# Patient Record
Sex: Female | Born: 1955
Health system: Southern US, Community
[De-identification: ages and names within clinical notes are randomized; demographics above are authoritative.]

## PROBLEM LIST (undated history)

## (undated) DIAGNOSIS — U071 COVID-19: Secondary | ICD-10-CM

## (undated) DIAGNOSIS — Z8669 Personal history of other diseases of the nervous system and sense organs: Secondary | ICD-10-CM

## (undated) DIAGNOSIS — N6019 Diffuse cystic mastopathy of unspecified breast: Secondary | ICD-10-CM

## (undated) DIAGNOSIS — M3500 Sicca syndrome, unspecified: Secondary | ICD-10-CM

## (undated) DIAGNOSIS — E785 Hyperlipidemia, unspecified: Secondary | ICD-10-CM

## (undated) DIAGNOSIS — M8580 Other specified disorders of bone density and structure, unspecified site: Secondary | ICD-10-CM

## (undated) DIAGNOSIS — E213 Hyperparathyroidism, unspecified: Secondary | ICD-10-CM

## (undated) DIAGNOSIS — R944 Abnormal results of kidney function studies: Secondary | ICD-10-CM

## (undated) HISTORY — PX: APPENDECTOMY: SHX54

## (undated) HISTORY — DX: Diffuse cystic mastopathy of unspecified breast: N60.19

## (undated) HISTORY — PX: OTHER SURGICAL HISTORY: SHX169

## (undated) HISTORY — DX: Hyperlipidemia, unspecified: E78.5

## (undated) HISTORY — DX: Sjogren syndrome, unspecified: M35.00

## (undated) HISTORY — DX: COVID-19: U07.1

## (undated) HISTORY — PX: TONSILLECTOMY AND ADENOIDECTOMY: SUR1326

## (undated) HISTORY — PX: WISDOM TOOTH EXTRACTION: SHX21

## (undated) HISTORY — DX: Hyperparathyroidism, unspecified: E21.3

## (undated) HISTORY — PX: PARATHYROIDECTOMY: SHX19

## (undated) HISTORY — DX: Other specified disorders of bone density and structure, unspecified site: M85.80

## (undated) HISTORY — PX: BREAST EXCISIONAL BIOPSY: SUR124

## (undated) HISTORY — PX: BREAST BIOPSY: SHX20

## (undated) HISTORY — DX: Personal history of other diseases of the nervous system and sense organs: Z86.69

## (undated) HISTORY — DX: Abnormal results of kidney function studies: R94.4

---

## 1997-09-16 ENCOUNTER — Ambulatory Visit (HOSPITAL_COMMUNITY): Admission: RE | Admit: 1997-09-16 | Discharge: 1997-09-16 | Payer: Self-pay | Admitting: Family Medicine

## 1998-10-02 ENCOUNTER — Ambulatory Visit (HOSPITAL_COMMUNITY): Admission: RE | Admit: 1998-10-02 | Discharge: 1998-10-02 | Payer: Self-pay | Admitting: Family Medicine

## 1999-12-04 ENCOUNTER — Ambulatory Visit (HOSPITAL_COMMUNITY): Admission: RE | Admit: 1999-12-04 | Discharge: 1999-12-04 | Payer: Self-pay | Admitting: Family Medicine

## 1999-12-04 ENCOUNTER — Encounter: Payer: Self-pay | Admitting: Family Medicine

## 2000-02-18 ENCOUNTER — Other Ambulatory Visit: Admission: RE | Admit: 2000-02-18 | Discharge: 2000-02-18 | Payer: Self-pay | Admitting: Family Medicine

## 2001-01-27 ENCOUNTER — Encounter: Payer: Self-pay | Admitting: Family Medicine

## 2001-01-27 ENCOUNTER — Ambulatory Visit (HOSPITAL_COMMUNITY): Admission: RE | Admit: 2001-01-27 | Discharge: 2001-01-27 | Payer: Self-pay | Admitting: Family Medicine

## 2001-02-26 ENCOUNTER — Other Ambulatory Visit: Admission: RE | Admit: 2001-02-26 | Discharge: 2001-02-26 | Payer: Self-pay | Admitting: Family Medicine

## 2001-07-08 HISTORY — PX: OTHER SURGICAL HISTORY: SHX169

## 2002-02-16 ENCOUNTER — Encounter: Payer: Self-pay | Admitting: Family Medicine

## 2002-02-16 ENCOUNTER — Ambulatory Visit (HOSPITAL_COMMUNITY): Admission: RE | Admit: 2002-02-16 | Discharge: 2002-02-16 | Payer: Self-pay | Admitting: Family Medicine

## 2002-03-15 ENCOUNTER — Other Ambulatory Visit: Admission: RE | Admit: 2002-03-15 | Discharge: 2002-03-15 | Payer: Self-pay | Admitting: Family Medicine

## 2002-04-19 ENCOUNTER — Ambulatory Visit (HOSPITAL_COMMUNITY): Admission: RE | Admit: 2002-04-19 | Discharge: 2002-04-19 | Payer: Self-pay | Admitting: Obstetrics and Gynecology

## 2003-03-01 ENCOUNTER — Ambulatory Visit (HOSPITAL_COMMUNITY): Admission: RE | Admit: 2003-03-01 | Discharge: 2003-03-01 | Payer: Self-pay | Admitting: Family Medicine

## 2003-03-01 ENCOUNTER — Encounter: Payer: Self-pay | Admitting: Family Medicine

## 2003-04-12 ENCOUNTER — Other Ambulatory Visit: Admission: RE | Admit: 2003-04-12 | Discharge: 2003-04-12 | Payer: Self-pay | Admitting: Obstetrics and Gynecology

## 2004-03-01 ENCOUNTER — Ambulatory Visit (HOSPITAL_COMMUNITY): Admission: RE | Admit: 2004-03-01 | Discharge: 2004-03-01 | Payer: Self-pay | Admitting: Family Medicine

## 2004-04-17 ENCOUNTER — Other Ambulatory Visit: Admission: RE | Admit: 2004-04-17 | Discharge: 2004-04-17 | Payer: Self-pay | Admitting: Family Medicine

## 2004-04-27 ENCOUNTER — Ambulatory Visit (HOSPITAL_COMMUNITY): Admission: RE | Admit: 2004-04-27 | Discharge: 2004-04-27 | Payer: Self-pay | Admitting: Family Medicine

## 2005-03-04 ENCOUNTER — Ambulatory Visit (HOSPITAL_COMMUNITY): Admission: RE | Admit: 2005-03-04 | Discharge: 2005-03-04 | Payer: Self-pay | Admitting: Family Medicine

## 2005-04-18 ENCOUNTER — Other Ambulatory Visit: Admission: RE | Admit: 2005-04-18 | Discharge: 2005-04-18 | Payer: Self-pay | Admitting: Family Medicine

## 2006-03-06 ENCOUNTER — Ambulatory Visit (HOSPITAL_COMMUNITY): Admission: RE | Admit: 2006-03-06 | Discharge: 2006-03-06 | Payer: Self-pay | Admitting: Family Medicine

## 2006-04-28 ENCOUNTER — Other Ambulatory Visit: Admission: RE | Admit: 2006-04-28 | Discharge: 2006-04-28 | Payer: Self-pay | Admitting: Family Medicine

## 2006-04-30 ENCOUNTER — Ambulatory Visit (HOSPITAL_COMMUNITY): Admission: RE | Admit: 2006-04-30 | Discharge: 2006-04-30 | Payer: Self-pay | Admitting: Family Medicine

## 2007-03-31 ENCOUNTER — Ambulatory Visit (HOSPITAL_COMMUNITY): Admission: RE | Admit: 2007-03-31 | Discharge: 2007-03-31 | Payer: Self-pay | Admitting: Family Medicine

## 2007-05-12 ENCOUNTER — Other Ambulatory Visit: Admission: RE | Admit: 2007-05-12 | Discharge: 2007-05-12 | Payer: Self-pay | Admitting: Family Medicine

## 2007-06-09 IMAGING — US US CAROTID DUPLEX BILAT
1 series · 14 of 24 positions shown · non-contrast
Comparison: NONE

CLINICAL DATA: cc. MMAMOSA MOSA PATEL-WAYNE  Abnormal [facility] screening. 

BILATERAL DUPLEX CAROTID ULTRASOUND

[Series 1: us carotid · 0.06mm/px · 14 of 56 slices shown]
[im 1/56]
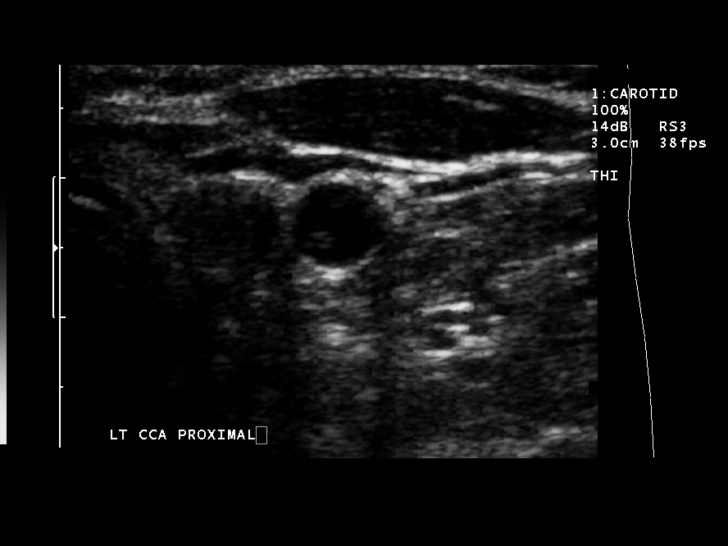
[im 5/56]
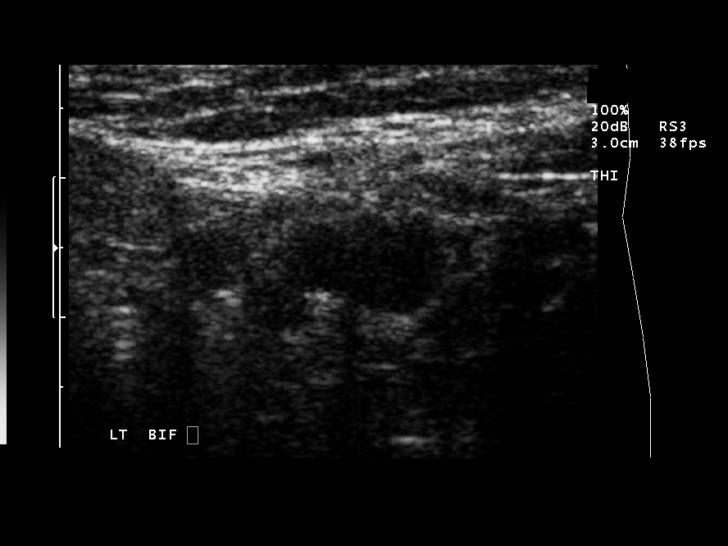
[im 10/56]
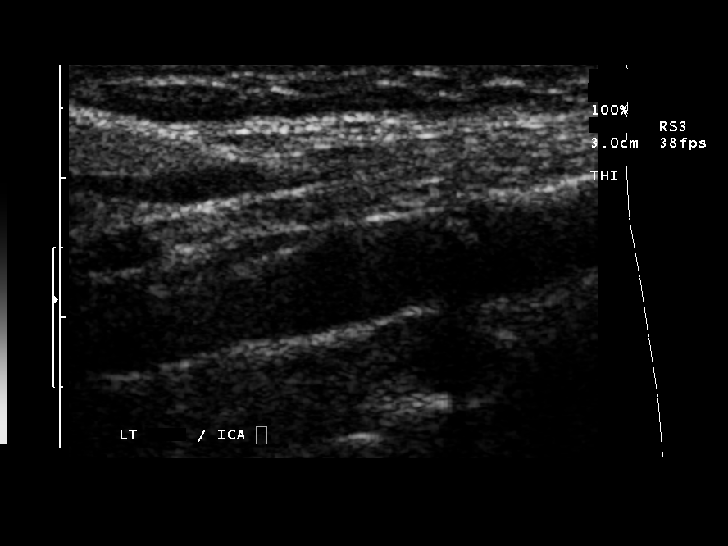
[im 15/56]
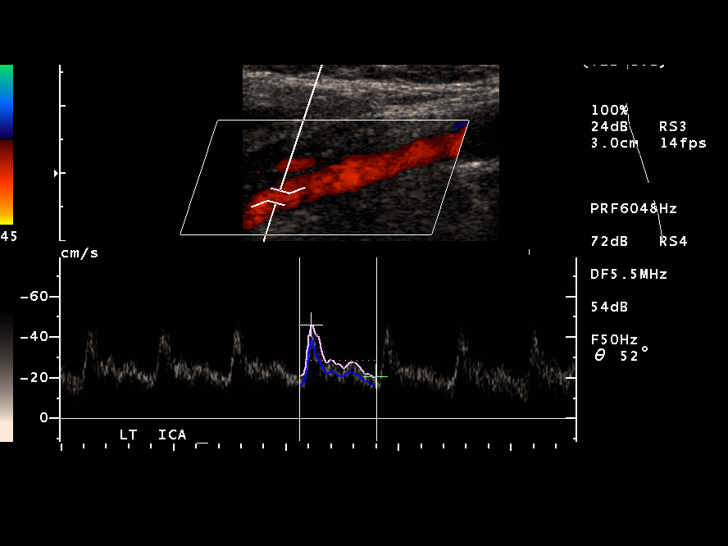
[im 17/56]
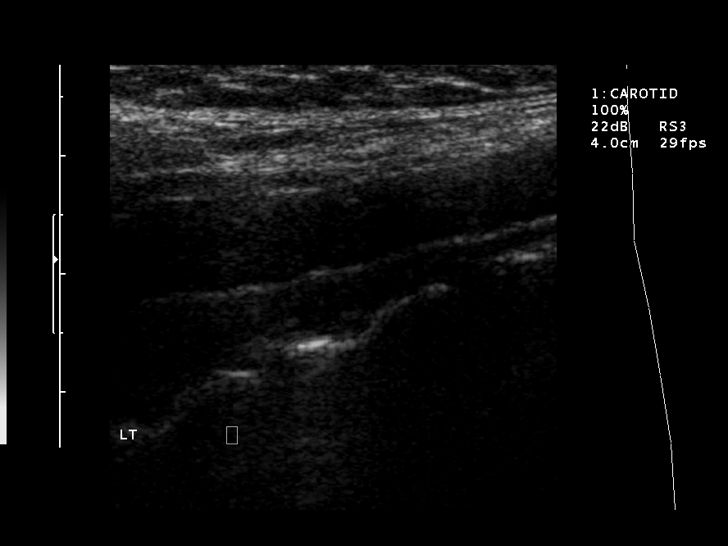
[im 22/56]
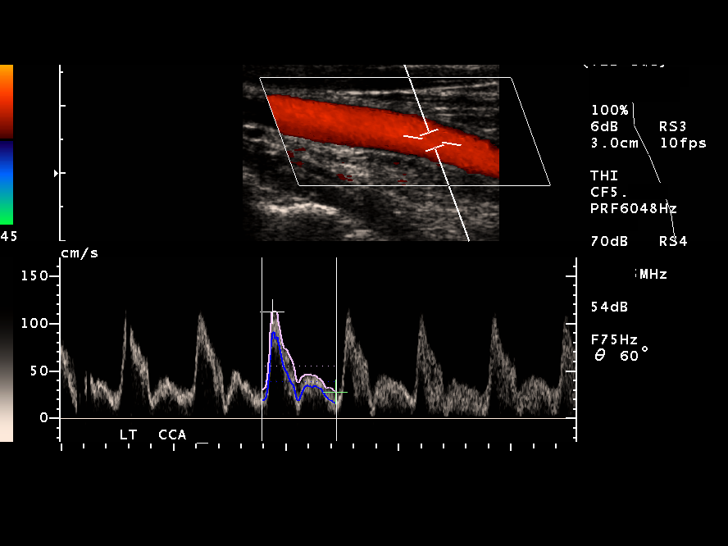
[im 27/56]
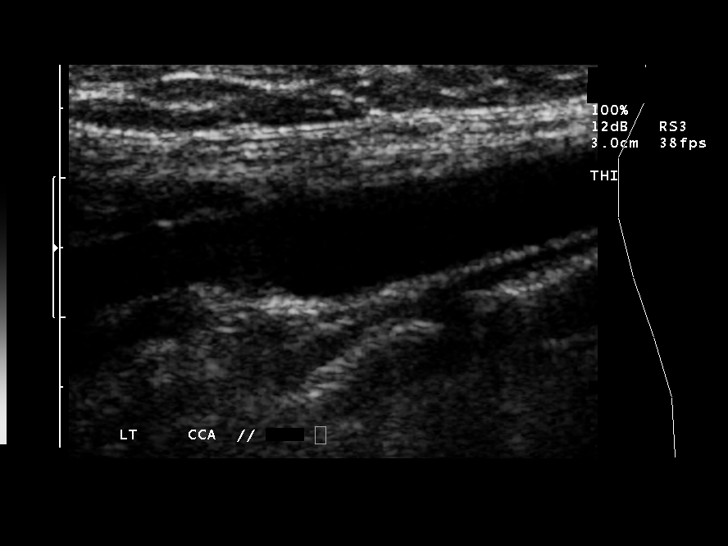
[im 29/56]
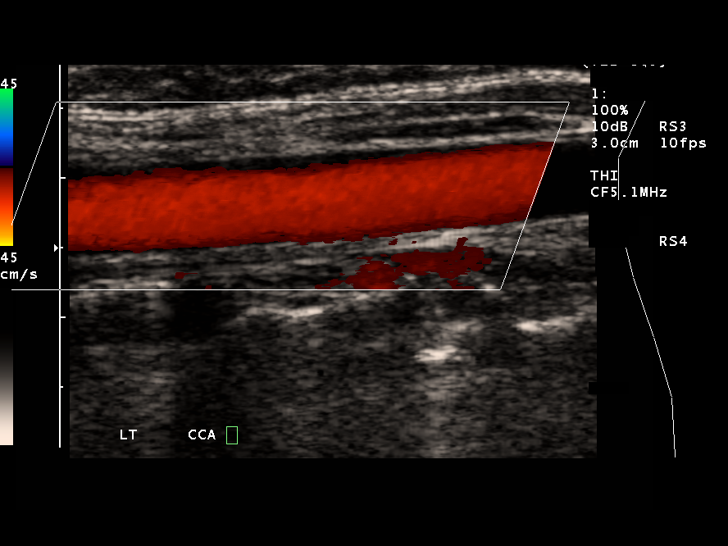
[im 34/56]
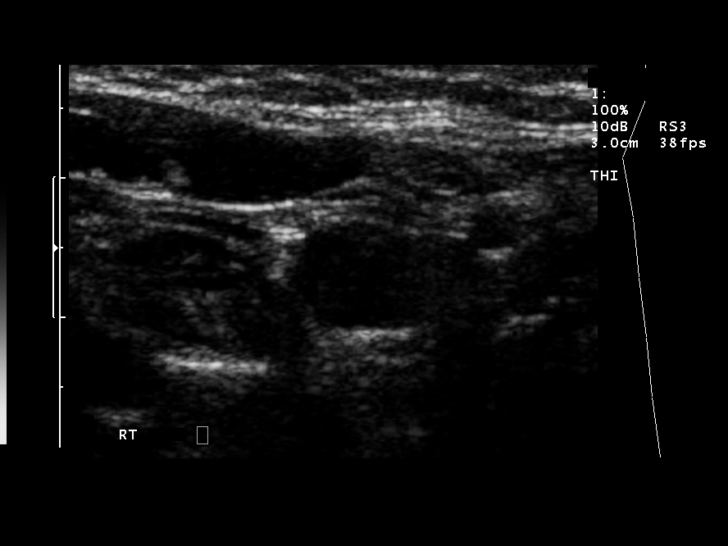
[im 39/56]
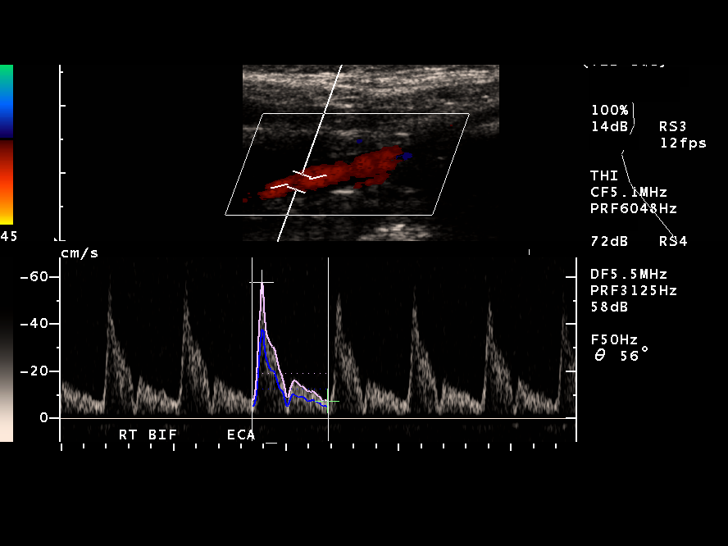
[im 44/56]
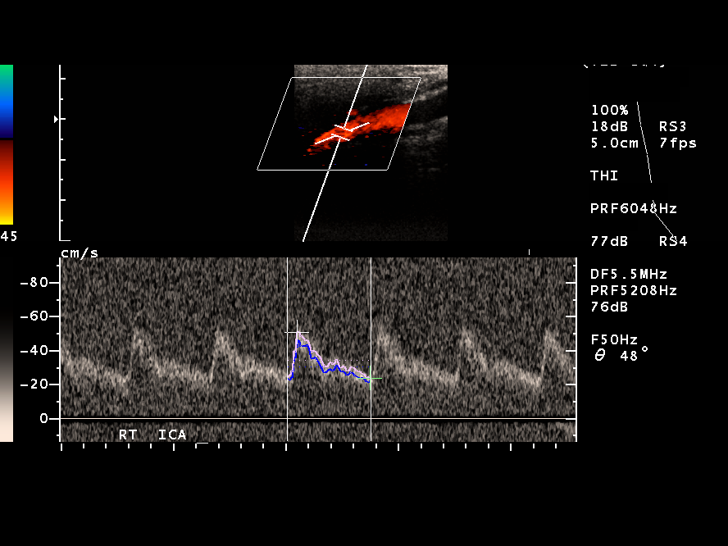
[im 46/56]
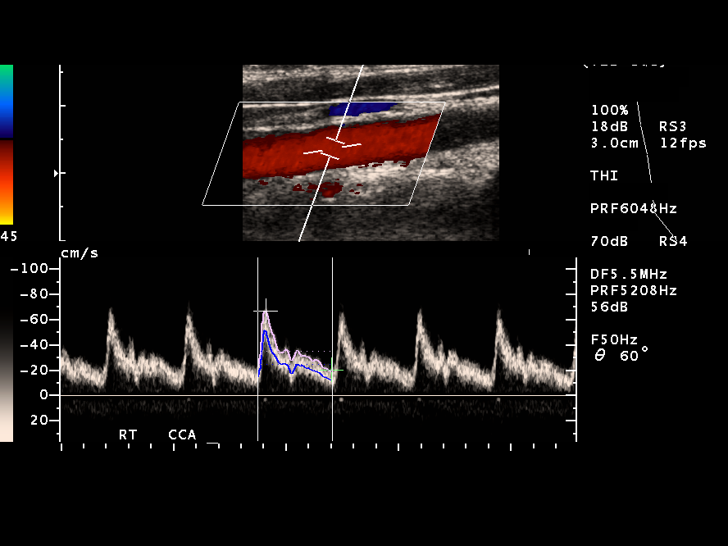
[im 51/56]
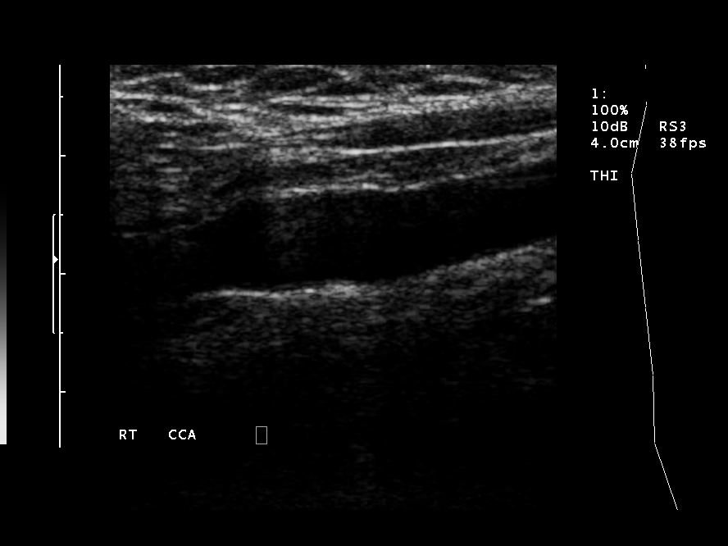
[im 56/56]
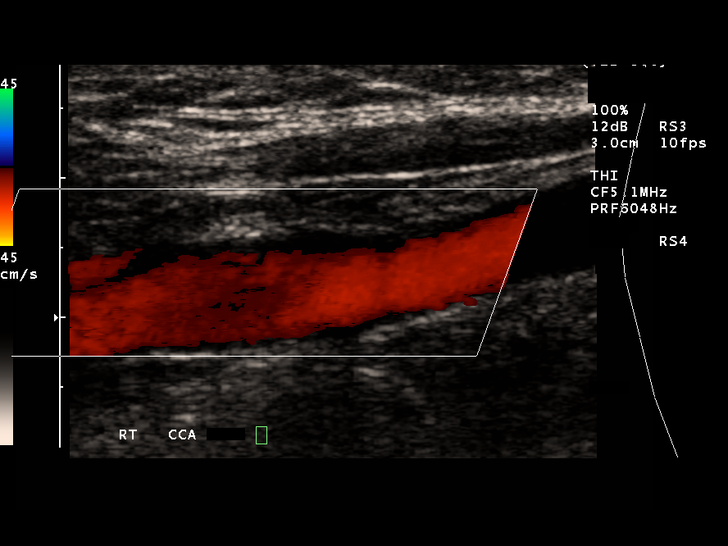

[14 of 24 positions shown; findings below may reference images not displayed]

FINDINGS RIGHT CAROTID: CCA 93, ICA 51, diastolic 24, ratio
Vertebral flow: antegrade LEFT CAROTID: CCA 112, ICA 66, diastolic 
26, ratio 0.59 Vertebral flow: antegrade Examination of the right 
carotid system demonstrates normal appearances without plaque 
present. Velocity measurements are within normal range. 
Examination of the left carotid system demonstrates minimal 
heterogeneous nodular plaque in the carotid bulb.
IMPRESSION: Normal appearance of the right carotid system. 
Minimal plaque in the left carotid bulb. Degree of stenosis of the 
left internal carotid artery is near 0%. Blayne Gonzalezz, M.D. 
electronically reviewed on 06/10/2007 Dict Date: 06/10/2007  Tran 
Date: 06/10/2007 CAV  [REDACTED]

## 2008-04-06 ENCOUNTER — Ambulatory Visit (HOSPITAL_COMMUNITY): Admission: RE | Admit: 2008-04-06 | Discharge: 2008-04-06 | Payer: Self-pay | Admitting: Family Medicine

## 2008-04-14 ENCOUNTER — Encounter: Admission: RE | Admit: 2008-04-14 | Discharge: 2008-04-14 | Payer: Self-pay | Admitting: Family Medicine

## 2008-10-11 ENCOUNTER — Encounter: Admission: RE | Admit: 2008-10-11 | Discharge: 2008-10-11 | Payer: Self-pay | Admitting: Family Medicine

## 2009-04-05 ENCOUNTER — Encounter: Admission: RE | Admit: 2009-04-05 | Discharge: 2009-04-05 | Payer: Self-pay | Admitting: Family Medicine

## 2009-05-25 ENCOUNTER — Other Ambulatory Visit: Admission: RE | Admit: 2009-05-25 | Discharge: 2009-05-25 | Payer: Self-pay | Admitting: Family Medicine

## 2010-04-10 ENCOUNTER — Ambulatory Visit (HOSPITAL_COMMUNITY): Admission: RE | Admit: 2010-04-10 | Discharge: 2010-04-10 | Payer: Self-pay | Admitting: Family Medicine

## 2010-07-18 ENCOUNTER — Other Ambulatory Visit
Admission: RE | Admit: 2010-07-18 | Discharge: 2010-07-18 | Payer: Self-pay | Source: Home / Self Care | Admitting: Family Medicine

## 2010-11-23 NOTE — Op Note (Signed)
NAME:  Karen Bass, Karen Bass                        ACCOUNT NO.:  [id number]   MEDICAL RECORD NO.:  [id number]                   PATIENT TYPE:  AMB   LOCATION:  SDC                                  FACILITY:  [facility]   PHYSICIAN:  Cynthia P. Romine, M.D.             DATE OF BIRTH:  1956-03-09   DATE OF PROCEDURE:  04/19/2002  DATE OF DISCHARGE:                                 OPERATIVE REPORT   PREOPERATIVE DIAGNOSIS:  Desires attempt at permanent surgical  sterilization.   POSTOPERATIVE DIAGNOSIS:  Desires attempt at permanent surgical  sterilization.   PROCEDURE:  Right-sided tubal sterilization procedure, left side was status  post left salpingo-oophorectomy.   SURGEON:  Cynthia P. Romine, M.D.   ANESTHESIA:  General endotracheal.   ESTIMATED BLOOD LOSS:  Minimal.   COMPLICATIONS:  None.   DESCRIPTION OF PROCEDURE:  The patient was taken to the operating room and  after the induction of adequate general anesthesia was prepped and draped in  the usual fashion.  A Hulka uterine manipulator was placed.  The bladder was  drained with a red rubber catheter.  A small infraumbilical incision was  made and the Veress needle was inserted into the peritoneal space.  Proper  placement was tested by noting free flow of saline through the Veress needle  with a negative aspirate and then by negative response of saline placed at  the hub of the Veress needle to negative pressure as the abdominal wall was  elevated.  Pneumoperitoneum was created with 2.5 L of CO2 using the  automatic insufflator.  A disposable 10/11 mm trocar was then inserted into  the peritoneal space and its proper placement noted with the laparoscope.  Next a small suprapubic incision was made in the midline and the lower  trocar, 8 mm, was inserted under direct visualization.  The pelvis was  inspected.  The anterior and posterior cul-de-sac were normal.  On the  patient's left side the tube and ovary were absent, and  there were adhesions  of fat to the left side of the uterus and the pelvic sidewall.  On the right  the tube was somewhat thickened but was mobile.  The ovary appeared normal.  The tube was traced to its fimbriated end, the midisthmic portion was  elevated, and the Falope ring was placed.  A good knuckle of tube was noted  to be contained within the ring and good blanching was encountered.  Photographic documentation was taken.  Marcaine 0.5% with epinephrine was  dripped over the fallopian tube for postop pain relief, instruments removed  from the abdomen, pneumoperitoneum was allowed to escape.  The incisions  were injected with the same Marcaine with epinephrine solution, and the  incisions were closed subcuticularly with 4-0 Vicryl Rapide.  The  instruments were removed from the vagina and the procedure was terminated.  The patient tolerated the procedure well and went in satisfactory condition  to postanesthesia recovery.                                               Cynthia P. Romine, M.D.    CPR/MEDQ  D:  04/19/2002  T:  04/19/2002  Job:  [id number]

## 2011-04-08 ENCOUNTER — Other Ambulatory Visit (HOSPITAL_COMMUNITY): Payer: Self-pay | Admitting: Family Medicine

## 2011-04-08 DIAGNOSIS — Z1231 Encounter for screening mammogram for malignant neoplasm of breast: Secondary | ICD-10-CM

## 2011-04-12 ENCOUNTER — Ambulatory Visit (HOSPITAL_COMMUNITY)
Admission: RE | Admit: 2011-04-12 | Discharge: 2011-04-12 | Disposition: A | Discharge status: Home / Self Care | Payer: BC Managed Care – PPO | Source: Ambulatory Visit | Attending: Family Medicine | Admitting: Family Medicine

## 2011-04-12 DIAGNOSIS — Z1231 Encounter for screening mammogram for malignant neoplasm of breast: Secondary | ICD-10-CM

## 2011-07-22 ENCOUNTER — Other Ambulatory Visit: Payer: Self-pay | Admitting: Family Medicine

## 2011-07-22 ENCOUNTER — Other Ambulatory Visit (HOSPITAL_COMMUNITY)
Admission: RE | Admit: 2011-07-22 | Discharge: 2011-07-22 | Disposition: A | Discharge status: Home / Self Care | Payer: BC Managed Care – PPO | Source: Ambulatory Visit | Attending: Family Medicine | Admitting: Family Medicine

## 2011-07-22 DIAGNOSIS — Z124 Encounter for screening for malignant neoplasm of cervix: Secondary | ICD-10-CM | POA: Insufficient documentation

## 2012-04-02 ENCOUNTER — Other Ambulatory Visit (HOSPITAL_COMMUNITY): Payer: Self-pay | Admitting: Family Medicine

## 2012-04-02 DIAGNOSIS — Z1231 Encounter for screening mammogram for malignant neoplasm of breast: Secondary | ICD-10-CM

## 2012-04-16 ENCOUNTER — Ambulatory Visit (HOSPITAL_COMMUNITY)
Admission: RE | Admit: 2012-04-16 | Discharge: 2012-04-16 | Disposition: A | Discharge status: Home / Self Care | Payer: BC Managed Care – PPO | Source: Ambulatory Visit | Attending: Family Medicine | Admitting: Family Medicine

## 2012-04-16 DIAGNOSIS — Z1231 Encounter for screening mammogram for malignant neoplasm of breast: Secondary | ICD-10-CM | POA: Insufficient documentation

## 2012-08-12 ENCOUNTER — Other Ambulatory Visit (HOSPITAL_COMMUNITY)
Admission: RE | Admit: 2012-08-12 | Discharge: 2012-08-12 | Disposition: A | Discharge status: Home / Self Care | Payer: BC Managed Care – PPO | Source: Ambulatory Visit | Attending: Family Medicine | Admitting: Family Medicine

## 2012-08-12 ENCOUNTER — Other Ambulatory Visit: Payer: Self-pay | Admitting: Family Medicine

## 2012-08-12 DIAGNOSIS — Z124 Encounter for screening for malignant neoplasm of cervix: Secondary | ICD-10-CM | POA: Insufficient documentation

## 2012-08-12 DIAGNOSIS — Z1151 Encounter for screening for human papillomavirus (HPV): Secondary | ICD-10-CM | POA: Insufficient documentation

## 2013-04-15 ENCOUNTER — Other Ambulatory Visit (HOSPITAL_COMMUNITY): Payer: Self-pay | Admitting: Family Medicine

## 2013-04-15 DIAGNOSIS — Z1231 Encounter for screening mammogram for malignant neoplasm of breast: Secondary | ICD-10-CM

## 2013-04-27 ENCOUNTER — Ambulatory Visit (HOSPITAL_COMMUNITY)
Admission: RE | Admit: 2013-04-27 | Discharge: 2013-04-27 | Disposition: A | Discharge status: Home / Self Care | Payer: BC Managed Care – PPO | Source: Ambulatory Visit | Attending: Family Medicine | Admitting: Family Medicine

## 2013-04-27 DIAGNOSIS — Z1231 Encounter for screening mammogram for malignant neoplasm of breast: Secondary | ICD-10-CM

## 2014-04-26 ENCOUNTER — Other Ambulatory Visit (HOSPITAL_COMMUNITY): Payer: Self-pay | Admitting: Family Medicine

## 2014-04-26 DIAGNOSIS — Z1231 Encounter for screening mammogram for malignant neoplasm of breast: Secondary | ICD-10-CM

## 2014-05-04 ENCOUNTER — Ambulatory Visit (HOSPITAL_COMMUNITY)
Admission: RE | Admit: 2014-05-04 | Discharge: 2014-05-04 | Disposition: A | Discharge status: Home / Self Care | Payer: BC Managed Care – PPO | Source: Ambulatory Visit | Attending: Family Medicine | Admitting: Family Medicine

## 2014-05-04 DIAGNOSIS — Z1231 Encounter for screening mammogram for malignant neoplasm of breast: Secondary | ICD-10-CM

## 2014-05-04 DIAGNOSIS — Z803 Family history of malignant neoplasm of breast: Secondary | ICD-10-CM | POA: Insufficient documentation

## 2015-04-12 ENCOUNTER — Other Ambulatory Visit: Payer: Self-pay

## 2015-04-12 DIAGNOSIS — Z1231 Encounter for screening mammogram for malignant neoplasm of breast: Secondary | ICD-10-CM

## 2015-05-08 ENCOUNTER — Ambulatory Visit
Admission: RE | Admit: 2015-05-08 | Discharge: 2015-05-08 | Disposition: A | Discharge status: Home / Self Care | Payer: BLUE CROSS/BLUE SHIELD | Source: Ambulatory Visit

## 2015-05-08 DIAGNOSIS — Z1231 Encounter for screening mammogram for malignant neoplasm of breast: Secondary | ICD-10-CM

## 2016-04-03 ENCOUNTER — Other Ambulatory Visit: Payer: Self-pay | Admitting: Family Medicine

## 2016-04-22 ENCOUNTER — Other Ambulatory Visit: Payer: Self-pay | Admitting: Family Medicine

## 2016-04-22 DIAGNOSIS — Z1231 Encounter for screening mammogram for malignant neoplasm of breast: Secondary | ICD-10-CM

## 2016-05-09 ENCOUNTER — Ambulatory Visit
Admission: RE | Admit: 2016-05-09 | Discharge: 2016-05-09 | Disposition: A | Discharge status: Home / Self Care | Payer: BLUE CROSS/BLUE SHIELD | Source: Ambulatory Visit | Attending: Family Medicine | Admitting: Family Medicine

## 2016-05-09 DIAGNOSIS — Z1231 Encounter for screening mammogram for malignant neoplasm of breast: Secondary | ICD-10-CM

## 2017-05-14 ENCOUNTER — Other Ambulatory Visit: Payer: Self-pay | Admitting: Family Medicine

## 2017-05-14 DIAGNOSIS — Z139 Encounter for screening, unspecified: Secondary | ICD-10-CM

## 2017-06-12 ENCOUNTER — Ambulatory Visit
Admission: RE | Admit: 2017-06-12 | Discharge: 2017-06-12 | Disposition: A | Discharge status: Home / Self Care | Payer: BLUE CROSS/BLUE SHIELD | Source: Ambulatory Visit | Attending: Family Medicine | Admitting: Family Medicine

## 2017-06-12 DIAGNOSIS — Z139 Encounter for screening, unspecified: Secondary | ICD-10-CM

## 2017-11-06 ENCOUNTER — Other Ambulatory Visit (HOSPITAL_COMMUNITY)
Admission: RE | Admit: 2017-11-06 | Discharge: 2017-11-06 | Disposition: A | Discharge status: Home / Self Care | Payer: 59 | Source: Ambulatory Visit | Attending: Family Medicine | Admitting: Family Medicine

## 2017-11-06 ENCOUNTER — Other Ambulatory Visit: Payer: Self-pay | Admitting: Family Medicine

## 2017-11-06 DIAGNOSIS — Z124 Encounter for screening for malignant neoplasm of cervix: Secondary | ICD-10-CM | POA: Diagnosis not present

## 2017-11-11 LAB — CYTOLOGY - PAP
Diagnosis: NEGATIVE
HPV: NOT DETECTED

## 2018-07-29 ENCOUNTER — Other Ambulatory Visit: Payer: Self-pay | Admitting: Family Medicine

## 2018-07-29 DIAGNOSIS — Z1231 Encounter for screening mammogram for malignant neoplasm of breast: Secondary | ICD-10-CM

## 2018-08-26 ENCOUNTER — Ambulatory Visit
Admission: RE | Admit: 2018-08-26 | Discharge: 2018-08-26 | Disposition: A | Discharge status: Home / Self Care | Payer: 59 | Source: Ambulatory Visit | Attending: Family Medicine | Admitting: Family Medicine

## 2018-08-26 DIAGNOSIS — Z1231 Encounter for screening mammogram for malignant neoplasm of breast: Secondary | ICD-10-CM

## 2018-08-26 IMAGING — MG DIGITAL SCREENING BILATERAL MAMMOGRAM WITH TOMO AND CAD
8 series · 8 of 24 positions shown · non-contrast
Comparison: Previous exam(s).

CLINICAL DATA: Screening.

EXAM:
DIGITAL SCREENING BILATERAL MAMMOGRAM WITH TOMO AND CAD

[L CC synth-2D]
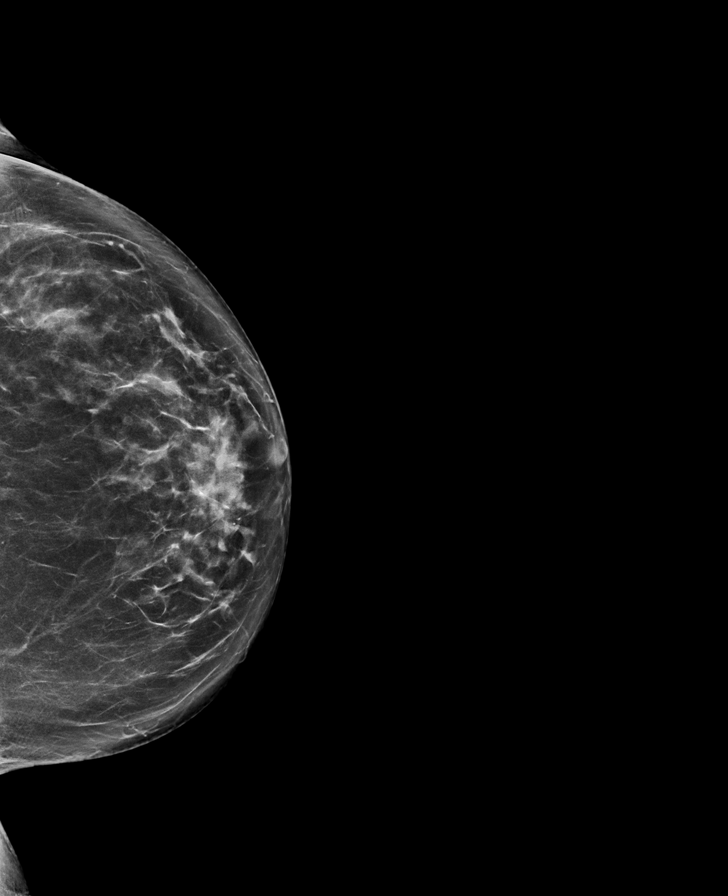

[R MLO synth-2D]
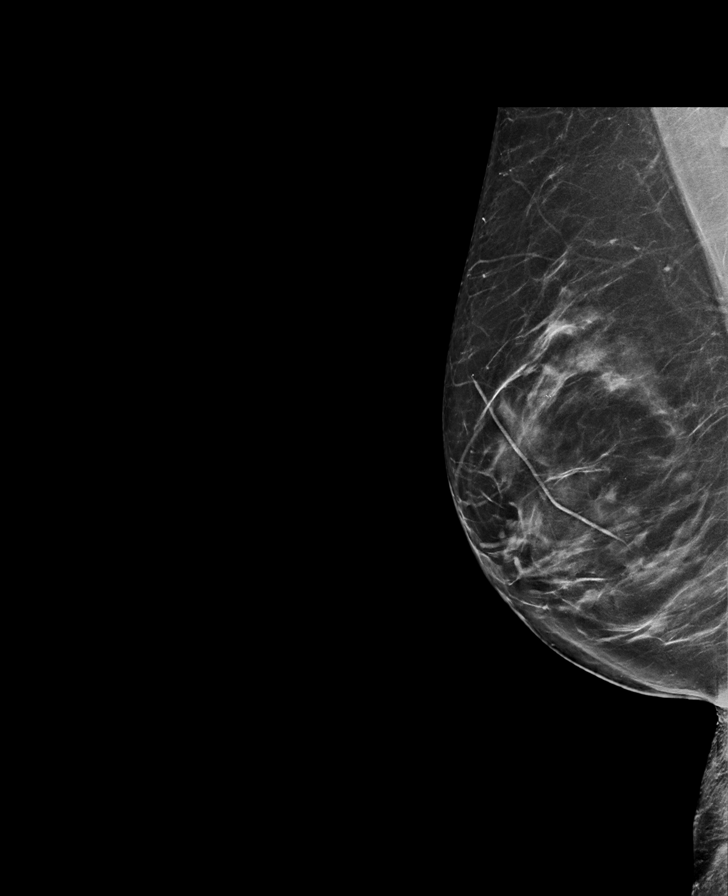

[L MLO synth-2D]
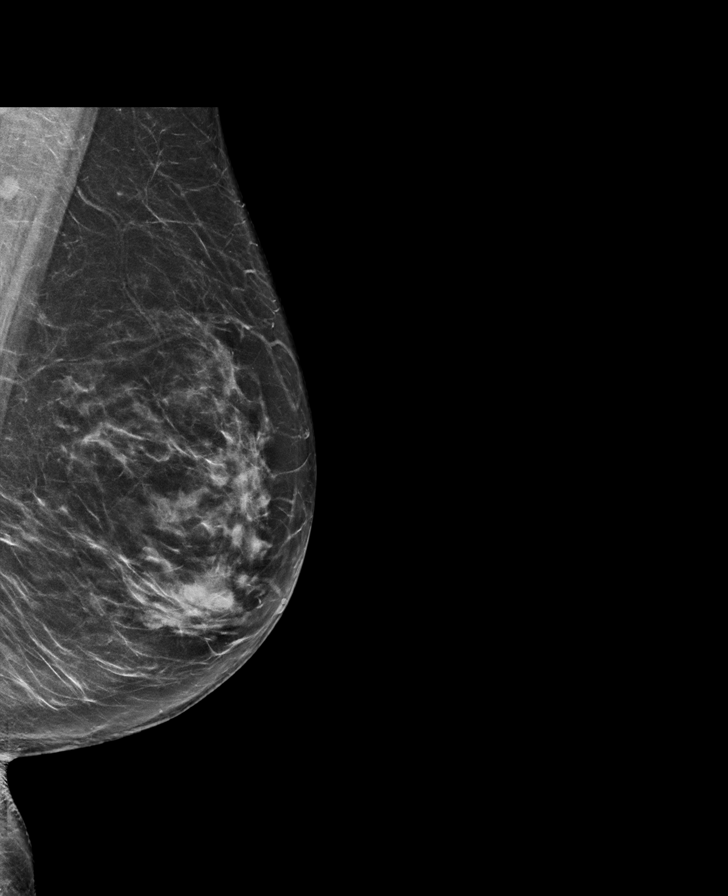

[R CC synth-2D]
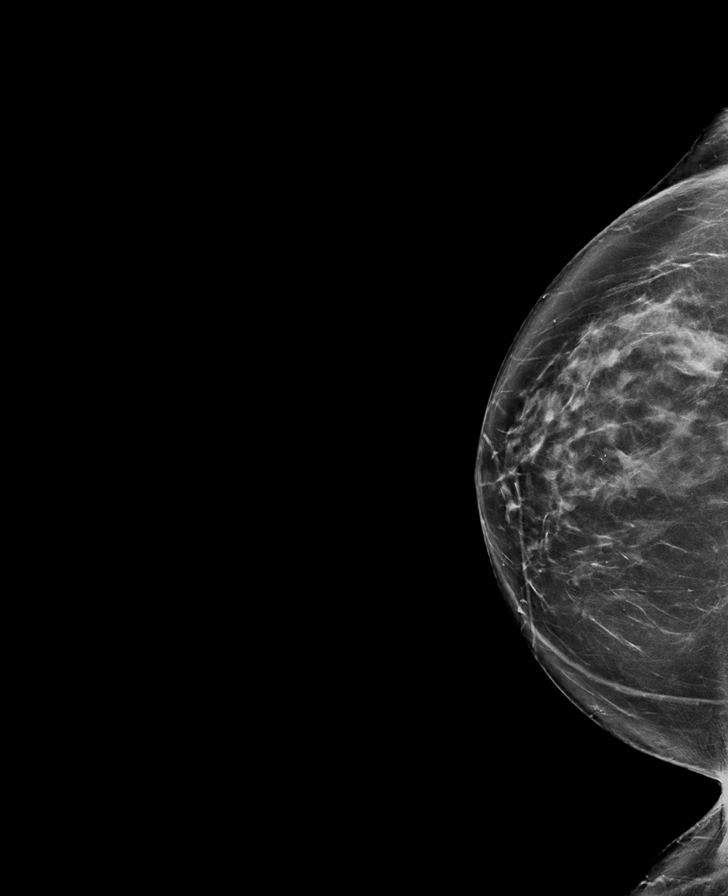

[L MLO tomo · tomo slice 39/76.0]
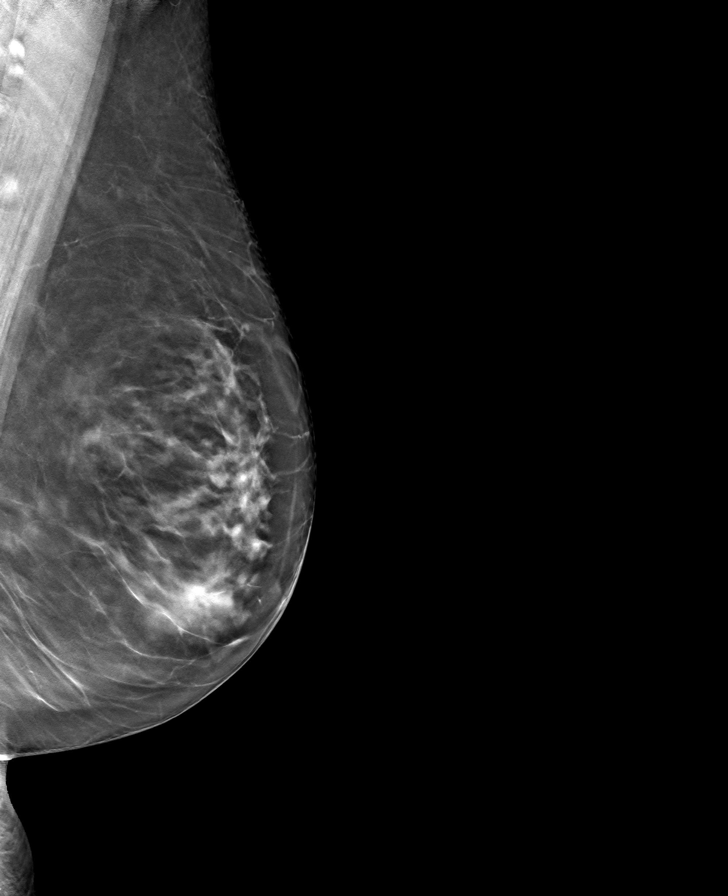

[R CC tomo · tomo slice 40/79.0]
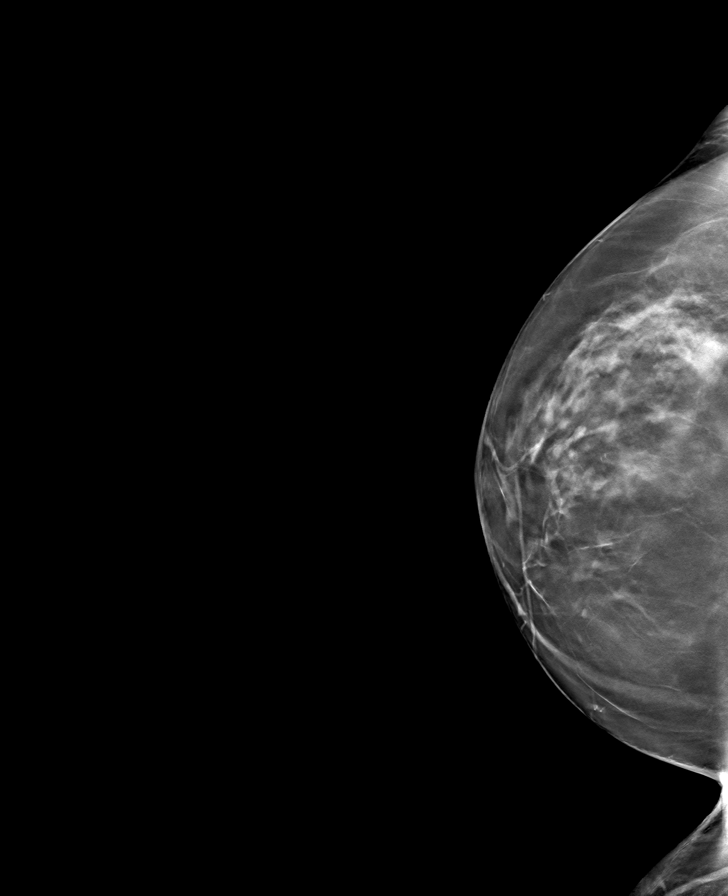

[R MLO tomo · tomo slice 38/75.0]
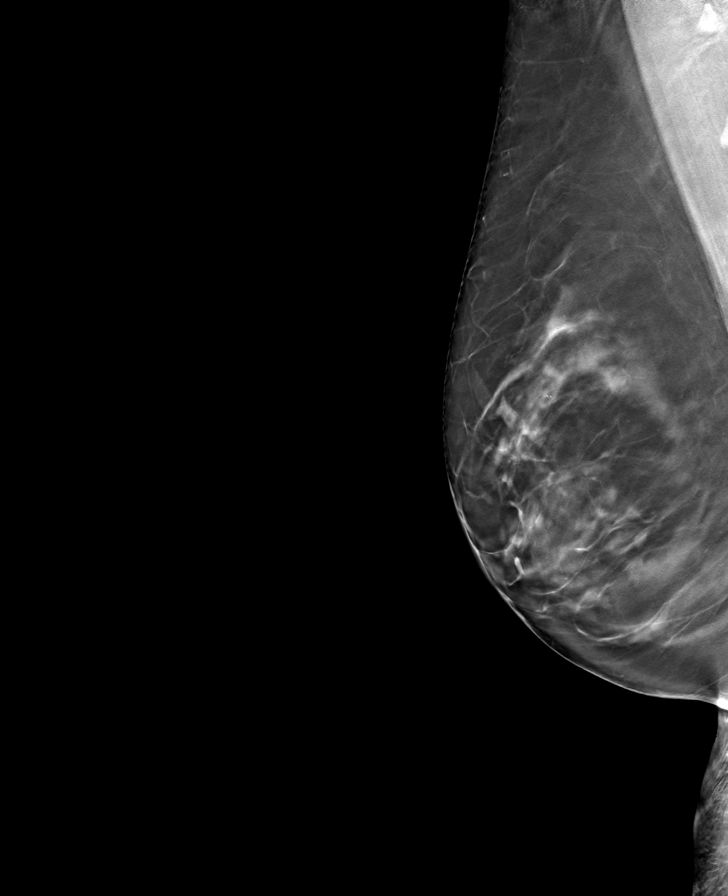

[L CC tomo · tomo slice 39/76.0]
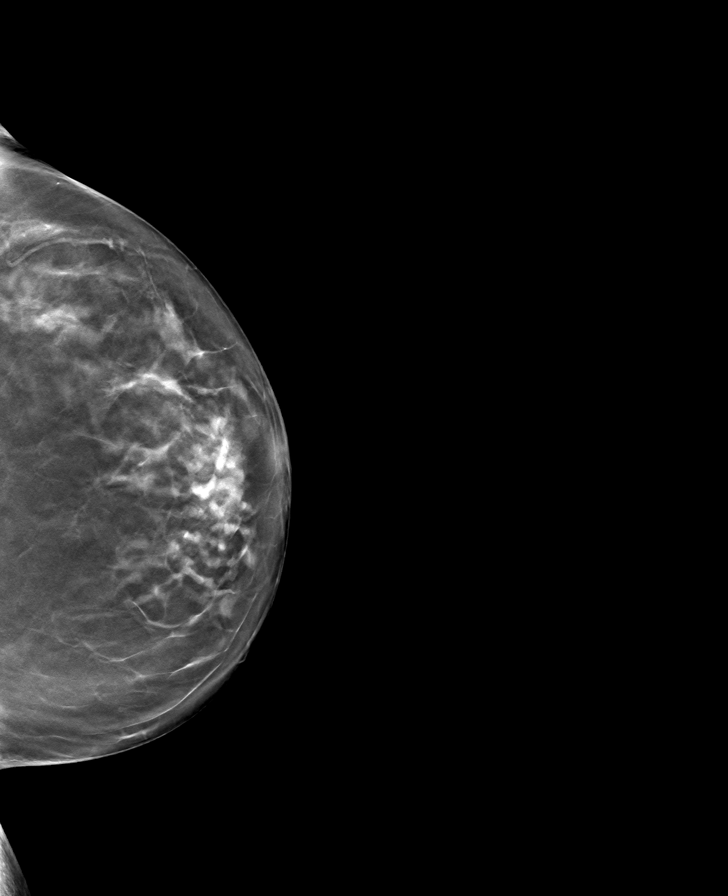

[8 of 24 positions shown; findings below may reference images not displayed]

ACR Breast Density Category c: The breast tissue is heterogeneously
dense, which may obscure small masses.
FINDINGS: There are no findings suspicious for malignancy. Images were
processed with CAD.
IMPRESSION: No mammographic evidence of malignancy. A result letter of this
screening mammogram will be mailed directly to the patient.

RECOMMENDATION:
Screening mammogram in one year. (Code:FT-[id number])

BI-RADS CATEGORY  1: Negative.

## 2018-11-11 DIAGNOSIS — M8580 Other specified disorders of bone density and structure, unspecified site: Secondary | ICD-10-CM | POA: Diagnosis not present

## 2018-11-11 DIAGNOSIS — M797 Fibromyalgia: Secondary | ICD-10-CM | POA: Diagnosis not present

## 2018-11-11 DIAGNOSIS — Z23 Encounter for immunization: Secondary | ICD-10-CM | POA: Diagnosis not present

## 2018-11-11 DIAGNOSIS — Z79899 Other long term (current) drug therapy: Secondary | ICD-10-CM | POA: Diagnosis not present

## 2018-11-11 DIAGNOSIS — E7800 Pure hypercholesterolemia, unspecified: Secondary | ICD-10-CM | POA: Diagnosis not present

## 2018-11-11 DIAGNOSIS — Z8639 Personal history of other endocrine, nutritional and metabolic disease: Secondary | ICD-10-CM | POA: Diagnosis not present

## 2018-11-11 DIAGNOSIS — Z1159 Encounter for screening for other viral diseases: Secondary | ICD-10-CM | POA: Diagnosis not present

## 2018-11-11 DIAGNOSIS — Z0000 Encounter for general adult medical examination without abnormal findings: Secondary | ICD-10-CM | POA: Diagnosis not present

## 2018-11-11 DIAGNOSIS — M899 Disorder of bone, unspecified: Secondary | ICD-10-CM | POA: Diagnosis not present

## 2019-06-18 ENCOUNTER — Other Ambulatory Visit: Payer: Self-pay

## 2019-06-18 DIAGNOSIS — Z20822 Contact with and (suspected) exposure to covid-19: Secondary | ICD-10-CM

## 2019-06-20 LAB — NOVEL CORONAVIRUS, NAA: SARS-CoV-2, NAA: NOT DETECTED

## 2019-07-07 DIAGNOSIS — R519 Headache, unspecified: Secondary | ICD-10-CM | POA: Diagnosis not present

## 2019-07-07 DIAGNOSIS — R439 Unspecified disturbances of smell and taste: Secondary | ICD-10-CM | POA: Diagnosis not present

## 2019-07-07 DIAGNOSIS — R0989 Other specified symptoms and signs involving the circulatory and respiratory systems: Secondary | ICD-10-CM | POA: Diagnosis not present

## 2019-07-07 DIAGNOSIS — R509 Fever, unspecified: Secondary | ICD-10-CM | POA: Diagnosis not present

## 2019-07-07 DIAGNOSIS — R05 Cough: Secondary | ICD-10-CM | POA: Diagnosis not present

## 2019-07-07 DIAGNOSIS — Z20828 Contact with and (suspected) exposure to other viral communicable diseases: Secondary | ICD-10-CM | POA: Diagnosis not present

## 2019-07-30 DIAGNOSIS — T1511XA Foreign body in conjunctival sac, right eye, initial encounter: Secondary | ICD-10-CM | POA: Diagnosis not present

## 2019-08-16 ENCOUNTER — Ambulatory Visit: Payer: Medicare Other | Attending: Internal Medicine

## 2019-08-16 DIAGNOSIS — Z23 Encounter for immunization: Secondary | ICD-10-CM | POA: Insufficient documentation

## 2019-08-16 NOTE — Progress Notes (Signed)
   Covid-19 Vaccination Clinic  Name:  Karen Bass    MRN: [id number] DOB: 03-21-1956  08/16/2019  [name] was observed post Covid-19 immunization for 15 minutes without incidence. She was provided with Vaccine Information Sheet and instruction to access the V-Safe system.   [name] was instructed to call 911 with any severe reactions post vaccine: Marland Kitchen Difficulty breathing  . Swelling of your face and throat  . A fast heartbeat  . A bad rash all over your body  . Dizziness and weakness    Immunizations Administered    Name Date Dose VIS Date Route   Pfizer COVID-19 Vaccine 08/16/2019  3:27 PM 0.3 mL 06/18/2019 Intramuscular   Manufacturer: [place]   Lot: [id number]   [place]: [id number]

## 2019-09-10 ENCOUNTER — Ambulatory Visit: Payer: Medicare Other | Attending: Internal Medicine

## 2019-09-10 DIAGNOSIS — Z23 Encounter for immunization: Secondary | ICD-10-CM

## 2019-09-10 NOTE — Progress Notes (Signed)
   Covid-19 Vaccination Clinic  Name:  Karen Bass    MRN: [id number] DOB: 1956/01/09  09/10/2019  Karen Bass was observed post Covid-19 immunization for 15 minutes without incident. She was provided with Vaccine Information Sheet and instruction to access the V-Safe system.   Karen Bass was instructed to call 911 with any severe reactions post vaccine: Marland Kitchen Difficulty breathing  . Swelling of face and throat  . A fast heartbeat  . A bad rash all over body  . Dizziness and weakness   Immunizations Administered    Name Date Dose VIS Date Route   Pfizer COVID-19 Vaccine 09/10/2019 11:20 AM 0.3 mL 06/18/2019 Intramuscular   Manufacturer: [place]   Lot: [id number]   [place]: [id number]

## 2019-10-20 ENCOUNTER — Other Ambulatory Visit: Payer: Self-pay | Admitting: Family Medicine

## 2019-10-20 DIAGNOSIS — Z1231 Encounter for screening mammogram for malignant neoplasm of breast: Secondary | ICD-10-CM

## 2019-11-04 ENCOUNTER — Ambulatory Visit
Admission: RE | Admit: 2019-11-04 | Discharge: 2019-11-04 | Disposition: A | Discharge status: Home / Self Care | Payer: Medicare Other | Source: Ambulatory Visit | Attending: Family Medicine | Admitting: Family Medicine

## 2019-11-04 ENCOUNTER — Other Ambulatory Visit: Payer: Self-pay

## 2019-11-04 DIAGNOSIS — Z1231 Encounter for screening mammogram for malignant neoplasm of breast: Secondary | ICD-10-CM | POA: Diagnosis not present

## 2019-11-04 IMAGING — MG DIGITAL SCREENING BILAT W/ TOMO W/ CAD
6 of 10 series · 6 of 30 positions shown · non-contrast
Comparison: Previous exam(s).

CLINICAL DATA: Screening.

EXAM:
DIGITAL SCREENING BILATERAL MAMMOGRAM WITH TOMO AND CAD

[R CC synth-2D (1 of 2)]
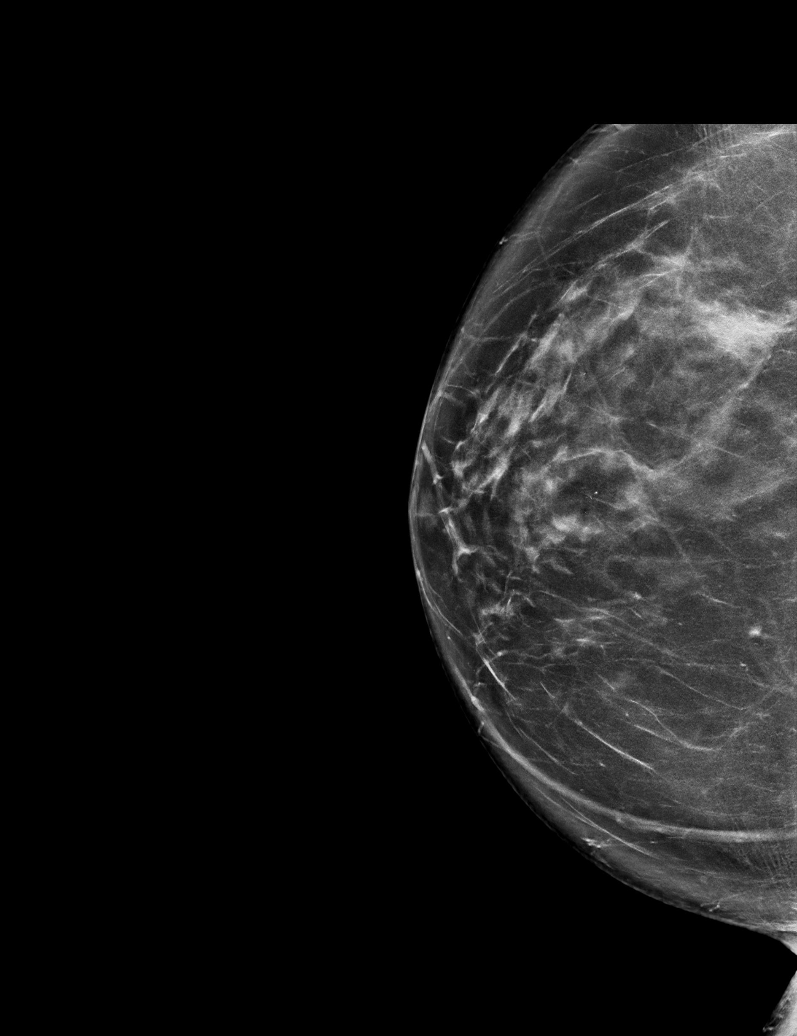

[R CC synth-2D (2 of 2)]
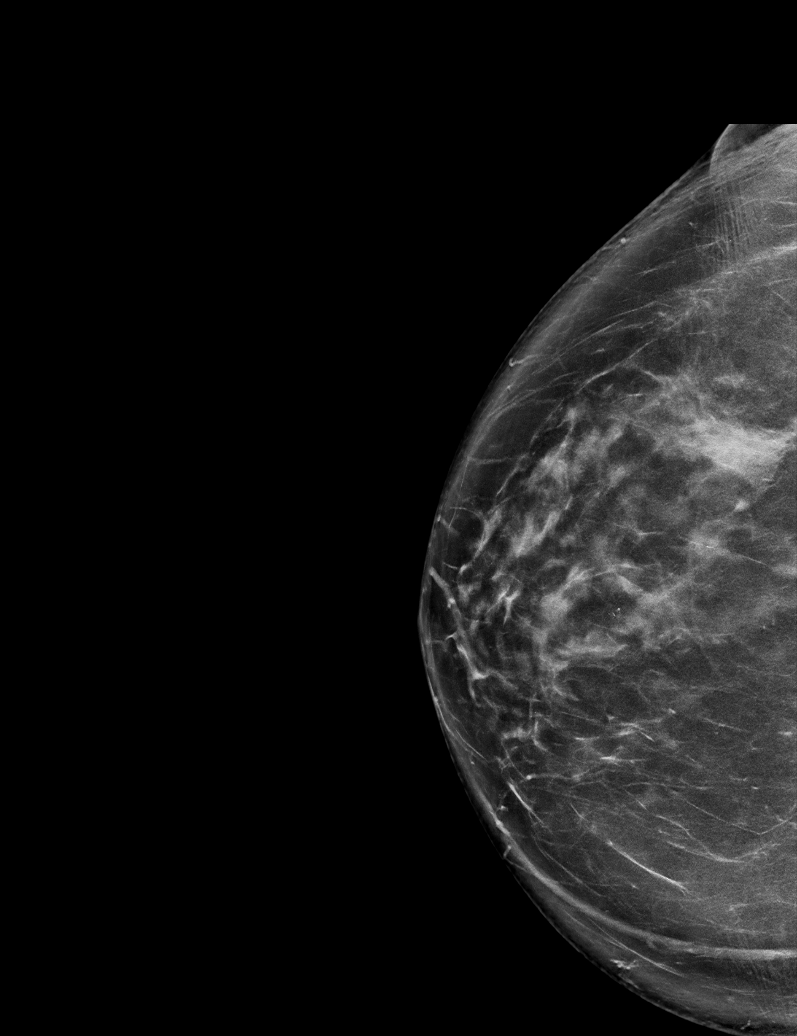

[L MLO synth-2D]
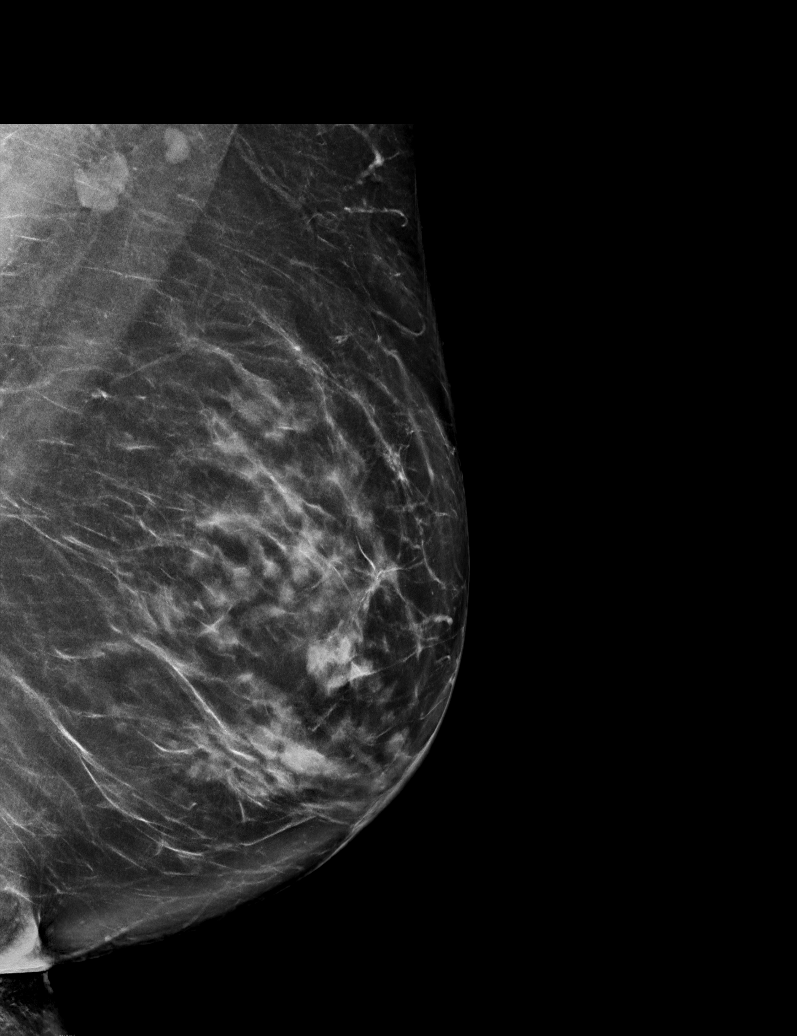

[L CC synth-2D]
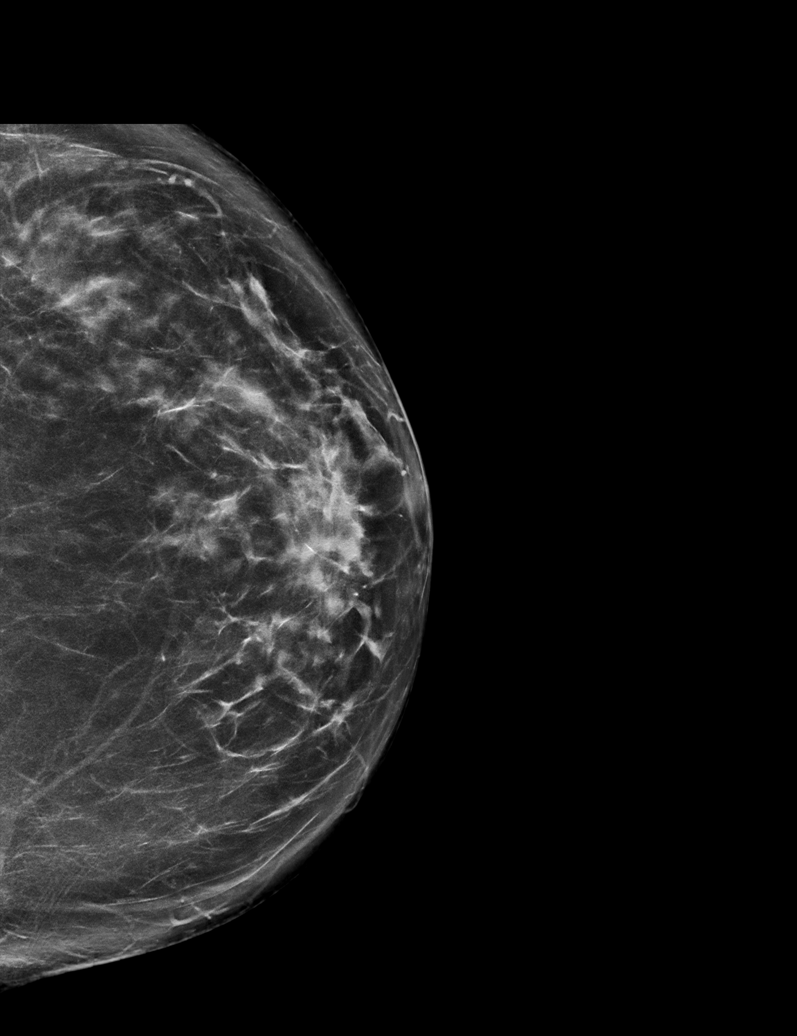

[R MLO synth-2D]
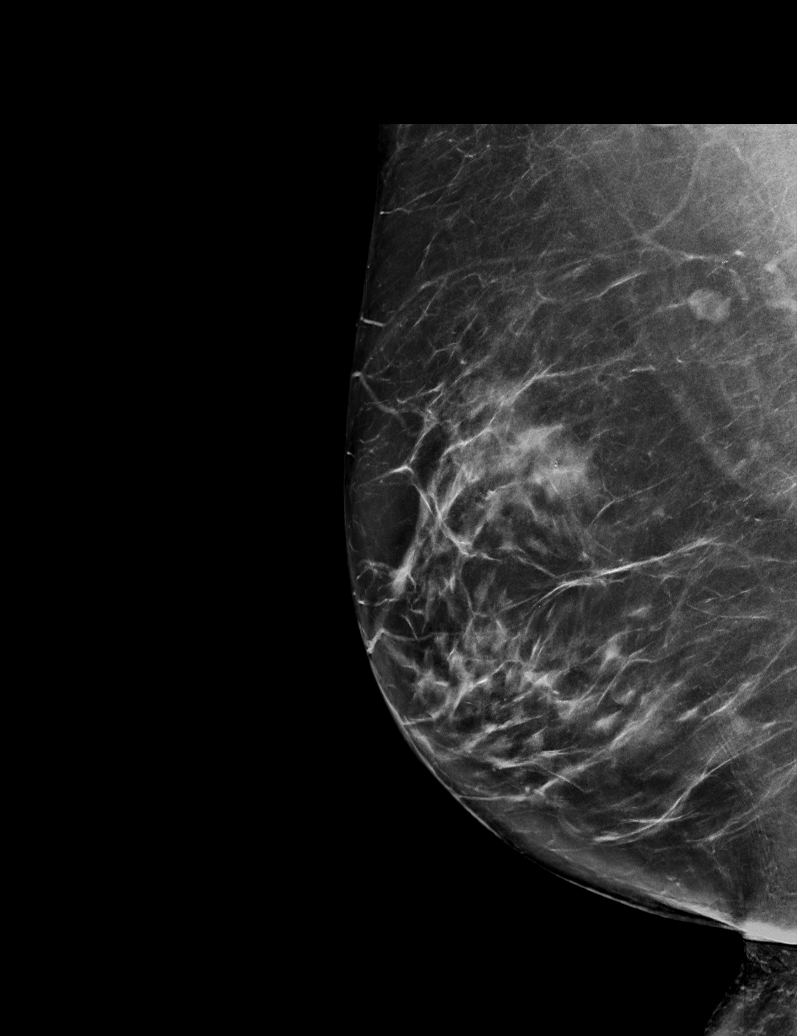

[R MLO tomo · tomo slice 41/80.0]
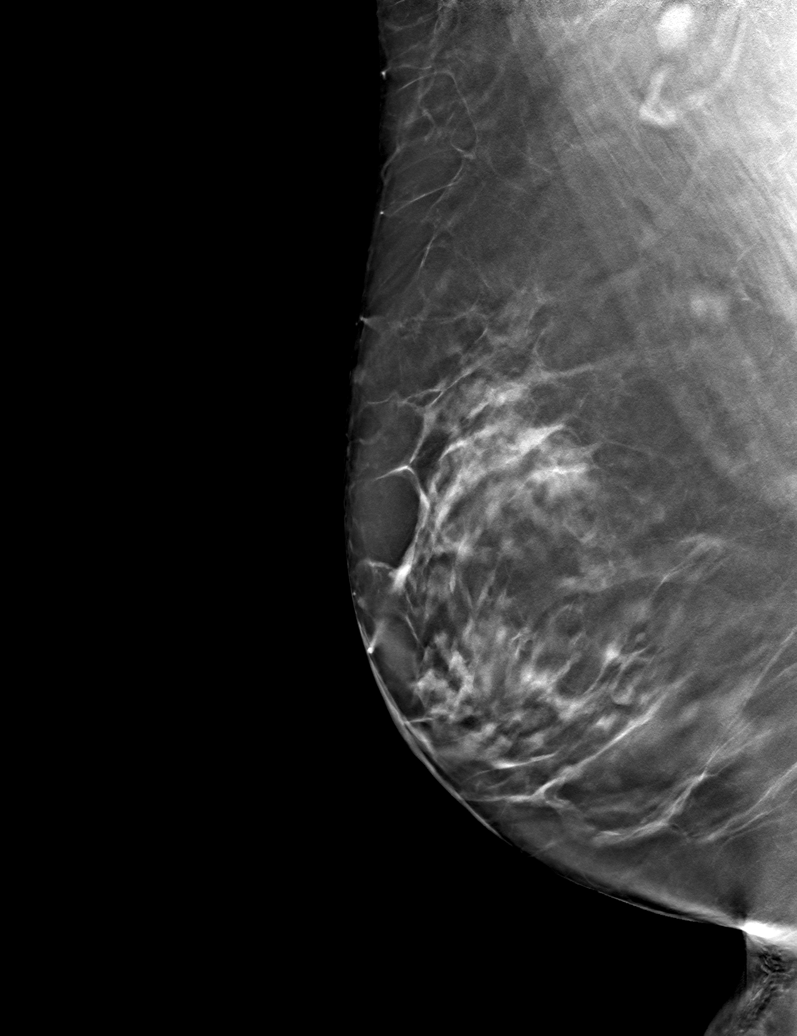

[6 of 30 positions shown; findings below may reference images not displayed]

ACR Breast Density Category c: The breast tissue is heterogeneously
dense, which may obscure small masses.
FINDINGS: There are no findings suspicious for malignancy. Images were
processed with CAD.
IMPRESSION: No mammographic evidence of malignancy. A result letter of this
screening mammogram will be mailed directly to the patient.

RECOMMENDATION:
Screening mammogram in one year. (Code:FT-[id number])

BI-RADS CATEGORY  1: Negative.

## 2019-12-01 DIAGNOSIS — Z0000 Encounter for general adult medical examination without abnormal findings: Secondary | ICD-10-CM | POA: Diagnosis not present

## 2019-12-01 DIAGNOSIS — Z8669 Personal history of other diseases of the nervous system and sense organs: Secondary | ICD-10-CM | POA: Diagnosis not present

## 2019-12-01 DIAGNOSIS — Z23 Encounter for immunization: Secondary | ICD-10-CM | POA: Diagnosis not present

## 2019-12-01 DIAGNOSIS — N1831 Chronic kidney disease, stage 3a: Secondary | ICD-10-CM | POA: Diagnosis not present

## 2019-12-01 DIAGNOSIS — E7800 Pure hypercholesterolemia, unspecified: Secondary | ICD-10-CM | POA: Diagnosis not present

## 2019-12-01 DIAGNOSIS — Z8639 Personal history of other endocrine, nutritional and metabolic disease: Secondary | ICD-10-CM | POA: Diagnosis not present

## 2019-12-01 DIAGNOSIS — M8580 Other specified disorders of bone density and structure, unspecified site: Secondary | ICD-10-CM | POA: Diagnosis not present

## 2019-12-01 DIAGNOSIS — M549 Dorsalgia, unspecified: Secondary | ICD-10-CM | POA: Diagnosis not present

## 2019-12-01 DIAGNOSIS — M797 Fibromyalgia: Secondary | ICD-10-CM | POA: Diagnosis not present

## 2019-12-01 DIAGNOSIS — M899 Disorder of bone, unspecified: Secondary | ICD-10-CM | POA: Diagnosis not present

## 2020-07-15 DIAGNOSIS — J069 Acute upper respiratory infection, unspecified: Secondary | ICD-10-CM | POA: Diagnosis not present

## 2020-07-15 DIAGNOSIS — U071 COVID-19: Secondary | ICD-10-CM | POA: Diagnosis not present

## 2020-09-12 NOTE — Progress Notes (Signed)
[facility]    Provider:  Dr Jaynee Eagles Requesting Provider: Hulan Fess, MD Primary Care Provider:  Hulan Fess, MD  CC:  headaches  HPI:  Karen Bass is a 65 y.o. female here as requested by Hulan Fess, MD for migraines. PMHx migraines, Sjogren's syndrome (according to patient now has a diagnosis of fibromyalgia and not Sjogren's), chronic kidney disease, hypercholesterolemia, hyperparathyroidism, sense of smell altered, osteopenia, fibromyalgia.  I reviewed Dr. Eddie Dibbles notes: Patient has been taking topiramate for about 7 months, she has decreased migraines from 6-7 a month to 2 a month, for the past 2 months has been seeing a "spider on 3 occasions it is not there", she also saw a straw in her drink but there was none there, she was referred to neurology to evaluate for possible hallucinations and discussed other treatments for migraines, it was thought the topiramate may have been affecting her vision or causing hallucinations.  Patient has been getting migraines, takes Excedrin occasionally it does not work.   She had hallucinations on Topiramate, she started it in May and she saw spiders and a straw that was not there. She stopped Topamax and went away January. Before 06/2019 she had 3 migraines a month, since covid she has 6 migraines a month that can last more than one day so about 8 migraine days a month. Excedrin migraine helps. Her eyes start hurting then her neck, can be unilateral, light sensitivity, pulsating/pounding/throbing, nausea, movement makes it worse, sound sensitivity, smells can make it worse. She has had migraines for years. No vision changes, not positional, these migraines have been stable, no significant changes.No other focal neurologic deficits, associated symptoms, inciting events or modifiable factors. Since stopping topamax she had one migraine last month and 1 this month.   Reviewed notes, labs and imaging from outside physicians, which  showed:  Medications tried that can be used in migraine management(from a thorough review of records): Topiramate(caused hallucinations), fioricet, imitrex(side effects, difficulty breathing, neck tightening), asa, amitrip,verapamil  May 2020 TSH normal, CBC normal, CMP normal except for GFR 55 consistent with mild chronic kidney disease.  Review of Systems: Patient complains of symptoms per HPI as well as the following symptoms: headache. Pertinent negatives and positives per HPI. All others negative.   Social History   Socioeconomic History  . Marital status: Widowed    Spouse name: Not on file  . Number of children: Not on file  . Years of education: Not on file  . Highest education level: Not on file  Occupational History  . Not on file  Tobacco Use  . Smoking status: Never Smoker  . Smokeless tobacco: Never Used  Vaping Use  . Vaping Use: Never used  Substance and Sexual Activity  . Alcohol use: Never  . Drug use: Never  . Sexual activity: Not on file  Other Topics Concern  . Not on file  Social History Narrative   Lives alone    Left handed to write, but does everything else with R hand   Caffeine: 16 oz 3-4 times a week   Social Determinants of Health   Financial Resource Strain: Not on file  Food Insecurity: Not on file  Transportation Needs: Not on file  Physical Activity: Not on file  Stress: Not on file  Social Connections: Not on file  Intimate Partner Violence: Not on file    Family History  Problem Relation Age of Onset  . Breast cancer Mother 42  postmenopausal breast cancer in her 25s  . Dementia Mother   . Basal cell carcinoma Mother   . Thyroid disease Father   . Hypertension Father   . Other Father        PMS, blood clot, elevated sugar   . High Cholesterol Brother        high triglycerides   . Allergic rhinitis Brother   . Heart Problems Brother   . Throat cancer Brother   . Hypertension Brother   . Dementia Maternal Grandmother    . Colon cancer Neg Hx   . Colon polyps Neg Hx   . Liver disease Neg Hx   . Migraines Neg Hx   . Headache Neg Hx     Past Medical History:  Diagnosis Date  . COVID-19 07/07/2019 and 07/15/2020   has had both vaccines   . Decreased GFR   . Fibrocystic breast disease   . History of migraine   . Hyperlipidemia   . Hyperparathyroidism ([place])    history  . Osteopenia    with bone density testing in March of 2014 with T score -1.6 in the femur  . Sjogren's syndrome ([place])    with positive ANA followed in the past by rheumatologist Dr Estanislado Pandy, according to patient now has diagnosis of fibromyalgia and not Sjogren's.     Patient Active Problem List   Diagnosis Date Noted  . Migraine without aura and without status migrainosus, not intractable 09/13/2020    Past Surgical History:  Procedure Laterality Date  . APPENDECTOMY    . BREAST BIOPSY    . BREAST EXCISIONAL BIOPSY Right   . BTL  2003  . ovarian cyst removal/unilateral oophorectomy    . PARATHYROIDECTOMY    . TONSILLECTOMY AND ADENOIDECTOMY    . WISDOM TOOTH EXTRACTION      Current Outpatient Medications  Medication Sig Dispense Refill  . aspirin 81 MG chewable tablet Chew 81 mg by mouth daily.    . Cholecalciferol (VITAMIN D) 125 MCG (5000 UT) CAPS Take 1 capsule by mouth. Monday, Wednesday, Friday    . CORAL CALCIUM PO Take 2 each by mouth daily. SUPREME    . Flaxseed, Linseed, (FLAX SEED OIL PO) Take 1 each by mouth daily. 1000 mg + 500 mg Omega 3    . Omega-3 Fatty Acids (FISH OIL) 1000 MG CAPS Take 2 capsules by mouth daily.    Marland Kitchen OVER THE COUNTER MEDICATION as needed. CORE ONE MIGRAINE RELIEF    . Ubrogepant (UBRELVY) 100 MG TABS Take 100 mg by mouth every 2 (two) hours as needed. Maximum 200mg  a day. 16 tablet 6   No current facility-administered medications for this visit.    Allergies as of 09/13/2020 - Review Complete 09/13/2020  Allergen Reaction Noted  . Tetracycline  07/15/2020    Vitals: BP (!)  137/91 (BP Location: Left Arm, Patient Position: Sitting)   Pulse 95   Ht 5\' 6"  (1.676 m)   Wt 177 lb (80.3 kg)   BMI 28.57 kg/m  Last Weight:  Wt Readings from Last 1 Encounters:  09/13/20 177 lb (80.3 kg)   Last Height:   Ht Readings from Last 1 Encounters:  09/13/20 5\' 6"  (1.676 m)     Physical exam: Exam: Gen: NAD, conversant, well nourised, well groomed                     CV: RRR, no MRG. No Carotid Bruits. No peripheral edema, warm, nontender Eyes: Conjunctivae  clear without exudates or hemorrhage  Neuro: Detailed Neurologic Exam  Speech:    Speech is normal; fluent and spontaneous with normal comprehension.  Cognition:    The patient is oriented to person, place, and time;     recent and remote memory intact;     language fluent;     normal attention, concentration,     fund of knowledge Cranial Nerves:    The pupils are equal, round, and reactive to light. The fundi are flat. Visual fields are full to finger confrontation. Extraocular movements are intact. Trigeminal sensation is intact and the muscles of mastication are normal. The face is symmetric. The palate elevates in the midline. Hearing intact. Voice is normal. Shoulder shrug is normal. The tongue has normal motion without fasciculations.   Coordination:    No ataxia or dysmetria Gait:    Normal native gait  Motor Observation:    No asymmetry, no atrophy, and no involuntary movements noted. Tone:    Normal muscle tone.    Posture:    Posture is normal. normal erect    Strength:    Strength is V/V in the upper and lower limbs.      Sensation: intact to LT     Reflex Exam:  DTR's:    Deep tendon reflexes in the upper and lower extremities are slightly brisk but symmetrical bilaterally.   Toes:    The toes are downgoing bilaterally.   Clonus:    Clonus is absent.    Assessment/Plan:  64 year old with episodic migraine without aura. She endorses improvement even since stopping Topiramate  (had hallucinations on it). She is very leery about taking medications and is worried about side effects. We discussed options including newer medications with much better side-effect profile. She declines preventative.  Triptans contraindicated due to side effects (SOB, swelling). Try [name]. acutely  No orders of the defined types were placed in this encounter.  Meds ordered this encounter  Medications  . Ubrogepant (UBRELVY) 100 MG TABS    Sig: Take 100 mg by mouth every 2 (two) hours as needed. Maximum 200mg  a day.    Dispense:  16 tablet    Refill:  6    Cc: Hulan Fess, MD,  Hulan Fess, MD  Sarina Ill, MD  [facility] [address] [place], [place] [address]  Phone [phone] Fax [phone]

## 2020-09-13 ENCOUNTER — Encounter: Payer: Self-pay | Admitting: *Deleted

## 2020-09-13 ENCOUNTER — Ambulatory Visit (INDEPENDENT_AMBULATORY_CARE_PROVIDER_SITE_OTHER): Payer: Medicare Other | Admitting: Neurology

## 2020-09-13 ENCOUNTER — Encounter: Payer: Self-pay | Admitting: Neurology

## 2020-09-13 VITALS — BP 137/91 | HR 95 | Ht 66.0 in | Wt 177.0 lb

## 2020-09-13 DIAGNOSIS — G43009 Migraine without aura, not intractable, without status migrainosus: Secondary | ICD-10-CM

## 2020-09-13 MED ORDER — UBRELVY 100 MG PO TABS: 100.0000 mg | ORAL_TABLET | ORAL | 6 refills | Status: DC | PRN

## 2020-09-13 NOTE — Patient Instructions (Addendum)
[name]: Please take one tablet at the onset of your headache. If it does not improve the symptoms please take one additional tablet. Do not take more then 2 tablets in 24hrs. Do not take use more then 2 to 3 times in a week.  Ubrogepant tablets What is this medicine? UBROGEPANT (ue BROE je pant) is used to treat migraine headaches with or without aura. An aura is a strange feeling or visual disturbance that warns you of an attack. It is not used to prevent migraines. This medicine may be used for other purposes; ask your health care provider or pharmacist if you have questions. COMMON BRAND NAME(S): [name] What should I tell my health care provider before I take this medicine? They need to know if you have any of these conditions:  kidney disease  liver disease  an unusual or allergic reaction to ubrogepant, other medicines, foods, dyes, or preservatives  pregnant or trying to get pregnant  breast-feeding How should I use this medicine? Take this medicine by mouth with a glass of water. Follow the directions on the prescription label. You can take it with or without food. If it upsets your stomach, take it with food. Take your medicine at regular intervals. Do not take it more often than directed. Do not stop taking except on your doctor's advice. Talk to your pediatrician about the use of this medicine in children. Special care may be needed. Overdosage: If you think you have taken too much of this medicine contact a poison control center or emergency room at once. NOTE: This medicine is only for you. Do not share this medicine with others. What if I miss a dose? This does not apply. This medicine is not for regular use. What may interact with this medicine? Do not take this medicine with any of the following medicines:  ceritinib  certain antibiotics like chloramphenicol, clarithromycin, telithromycin  certain antivirals for HIV like atazanavir, cobicistat, darunavir,  delavirdine, fosamprenavir, indinavir, ritonavir  certain medicines for fungal infections like itraconazole, ketoconazole, posaconazole, voriconazole  conivaptan  grapefruit  idelalisib  mifepristone  nefazodone  ribociclib This medicine may also interact with the following medications:  carvedilol  certain medicines for seizures like phenobarbital, phenytoin  ciprofloxacin  cyclosporine  eltrombopag  fluconazole  fluvoxamine  quinidine  rifampin  St. John's wort  verapamil This list may not describe all possible interactions. Give your health care provider a list of all the medicines, herbs, non-prescription drugs, or dietary supplements you use. Also tell them if you smoke, drink alcohol, or use illegal drugs. Some items may interact with your medicine. What should I watch for while using this medicine? Visit your health care professional for regular checks on your progress. Tell your health care professional if your symptoms do not start to get better or if they get worse. Your mouth may get dry. Chewing sugarless gum or sucking hard candy and drinking plenty of water may help. Contact your health care professional if the problem does not go away or is severe. What side effects may I notice from receiving this medicine? Side effects that you should report to your doctor or health care professional as soon as possible:  allergic reactions like skin rash, itching or hives; swelling of the face, lips, or tongue Side effects that usually do not require medical attention (report these to your doctor or health care professional if they continue or are bothersome):  drowsiness  dry mouth  nausea  tiredness This list may not describe  all possible side effects. Call your doctor for medical advice about side effects. You may report side effects to FDA at 1-800-FDA-1088. Where should I keep my medicine? Keep out of the reach of children. Store at room temperature  between 15 and 30 degrees C (59 and 86 degrees F). Throw away any unused medicine after the expiration date. NOTE: This sheet is a summary. It may not cover all possible information. If you have questions about this medicine, talk to your doctor, pharmacist, or health care provider.  2021 Elsevier/Gold Standard (2018-09-10 08:50:55)

## 2020-09-26 ENCOUNTER — Telehealth: Payer: Self-pay | Admitting: *Deleted

## 2020-09-26 NOTE — Telephone Encounter (Signed)
Per Cover My Meds, [name] approved by [employer]. "This approval authorizes your coverage from 07/08/2020 - 07/07/2021". Notified pt and pharmacy. Received a receipt of confirmation.

## 2020-09-26 NOTE — Telephone Encounter (Signed)
Completed [name] PA on Cover My Meds. Key: [id number]. Awaiting determination from [facility] [employer] Medicare Part D.

## 2020-12-14 ENCOUNTER — Other Ambulatory Visit: Payer: Self-pay | Admitting: Family Medicine

## 2020-12-14 DIAGNOSIS — M8580 Other specified disorders of bone density and structure, unspecified site: Secondary | ICD-10-CM

## 2020-12-14 DIAGNOSIS — Z1389 Encounter for screening for other disorder: Secondary | ICD-10-CM | POA: Diagnosis not present

## 2020-12-14 DIAGNOSIS — Z0000 Encounter for general adult medical examination without abnormal findings: Secondary | ICD-10-CM | POA: Diagnosis not present

## 2020-12-15 DIAGNOSIS — Z0000 Encounter for general adult medical examination without abnormal findings: Secondary | ICD-10-CM | POA: Diagnosis not present

## 2020-12-15 DIAGNOSIS — E7800 Pure hypercholesterolemia, unspecified: Secondary | ICD-10-CM | POA: Diagnosis not present

## 2020-12-15 DIAGNOSIS — M3500 Sicca syndrome, unspecified: Secondary | ICD-10-CM | POA: Diagnosis not present

## 2020-12-15 DIAGNOSIS — G43109 Migraine with aura, not intractable, without status migrainosus: Secondary | ICD-10-CM | POA: Diagnosis not present

## 2020-12-15 DIAGNOSIS — R21 Rash and other nonspecific skin eruption: Secondary | ICD-10-CM | POA: Diagnosis not present

## 2020-12-15 DIAGNOSIS — R635 Abnormal weight gain: Secondary | ICD-10-CM | POA: Diagnosis not present

## 2020-12-15 DIAGNOSIS — N1831 Chronic kidney disease, stage 3a: Secondary | ICD-10-CM | POA: Diagnosis not present

## 2020-12-15 DIAGNOSIS — M791 Myalgia, unspecified site: Secondary | ICD-10-CM | POA: Diagnosis not present

## 2020-12-19 DIAGNOSIS — G43109 Migraine with aura, not intractable, without status migrainosus: Secondary | ICD-10-CM | POA: Diagnosis not present

## 2020-12-19 DIAGNOSIS — M791 Myalgia, unspecified site: Secondary | ICD-10-CM | POA: Diagnosis not present

## 2020-12-19 DIAGNOSIS — R635 Abnormal weight gain: Secondary | ICD-10-CM | POA: Diagnosis not present

## 2020-12-19 DIAGNOSIS — E7800 Pure hypercholesterolemia, unspecified: Secondary | ICD-10-CM | POA: Diagnosis not present

## 2020-12-19 DIAGNOSIS — Z0000 Encounter for general adult medical examination without abnormal findings: Secondary | ICD-10-CM | POA: Diagnosis not present

## 2020-12-19 DIAGNOSIS — M3500 Sicca syndrome, unspecified: Secondary | ICD-10-CM | POA: Diagnosis not present

## 2020-12-19 DIAGNOSIS — R21 Rash and other nonspecific skin eruption: Secondary | ICD-10-CM | POA: Diagnosis not present

## 2020-12-19 DIAGNOSIS — N1831 Chronic kidney disease, stage 3a: Secondary | ICD-10-CM | POA: Diagnosis not present

## 2020-12-20 ENCOUNTER — Other Ambulatory Visit: Payer: Self-pay | Admitting: Family Medicine

## 2020-12-20 DIAGNOSIS — Z1231 Encounter for screening mammogram for malignant neoplasm of breast: Secondary | ICD-10-CM

## 2020-12-20 DIAGNOSIS — M8580 Other specified disorders of bone density and structure, unspecified site: Secondary | ICD-10-CM

## 2021-01-17 ENCOUNTER — Ambulatory Visit
Admission: RE | Admit: 2021-01-17 | Discharge: 2021-01-17 | Disposition: A | Discharge status: Home / Self Care | Payer: Medicare Other | Source: Ambulatory Visit | Attending: Family Medicine | Admitting: Family Medicine

## 2021-01-17 ENCOUNTER — Other Ambulatory Visit: Payer: Self-pay

## 2021-01-17 DIAGNOSIS — M8580 Other specified disorders of bone density and structure, unspecified site: Secondary | ICD-10-CM

## 2021-01-17 DIAGNOSIS — M85832 Other specified disorders of bone density and structure, left forearm: Secondary | ICD-10-CM | POA: Diagnosis not present

## 2021-01-17 DIAGNOSIS — Z780 Asymptomatic menopausal state: Secondary | ICD-10-CM | POA: Diagnosis not present

## 2021-01-17 DIAGNOSIS — Z1231 Encounter for screening mammogram for malignant neoplasm of breast: Secondary | ICD-10-CM | POA: Diagnosis not present

## 2021-01-17 DIAGNOSIS — M85852 Other specified disorders of bone density and structure, left thigh: Secondary | ICD-10-CM | POA: Diagnosis not present

## 2021-01-17 IMAGING — MG MM DIGITAL SCREENING BILAT W/ TOMO AND CAD
8 series · 8 of 24 positions shown · non-contrast
Comparison: Previous exam(s).

CLINICAL DATA: Screening.

EXAM:
DIGITAL SCREENING BILATERAL MAMMOGRAM WITH TOMOSYNTHESIS AND CAD
TECHNIQUE: Bilateral screening digital craniocaudal and mediolateral oblique
mammograms were obtained. Bilateral screening digital breast
tomosynthesis was performed. The images were evaluated with
computer-aided detection.

[L MLO synth-2D]
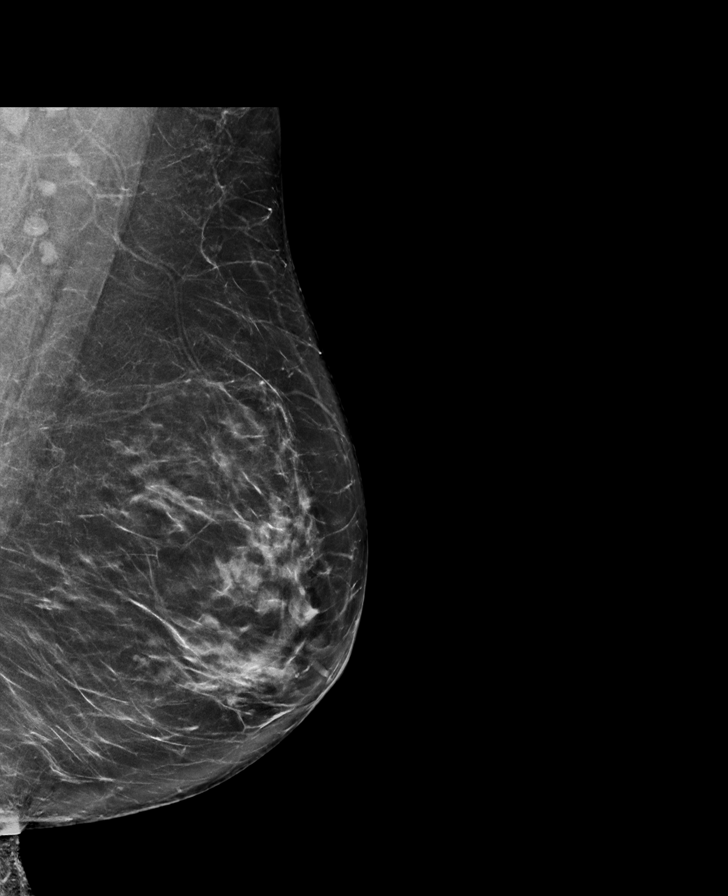

[R MLO synth-2D]
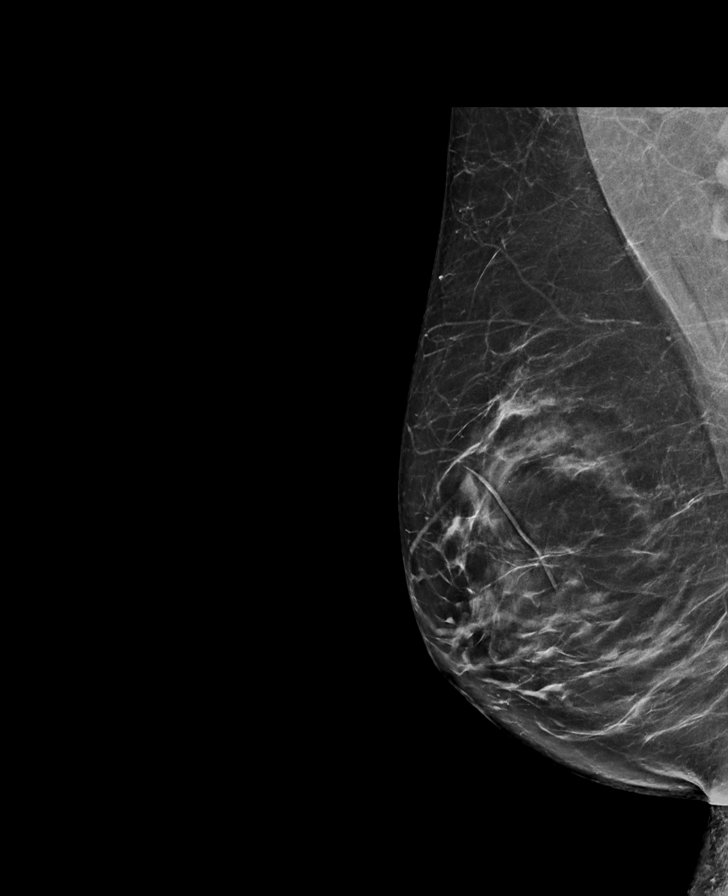

[L CC synth-2D]
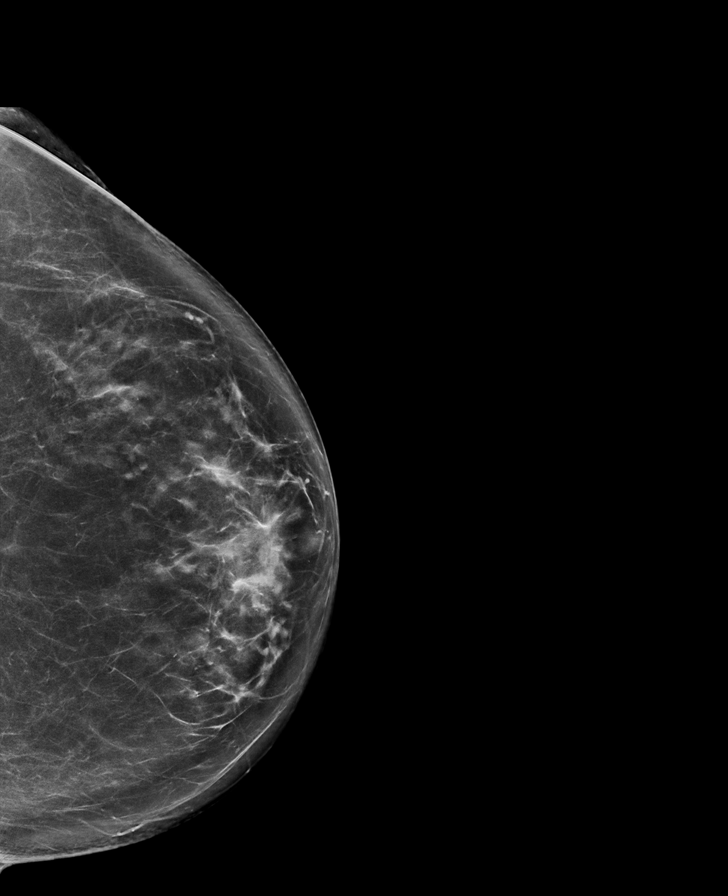

[R CC synth-2D]
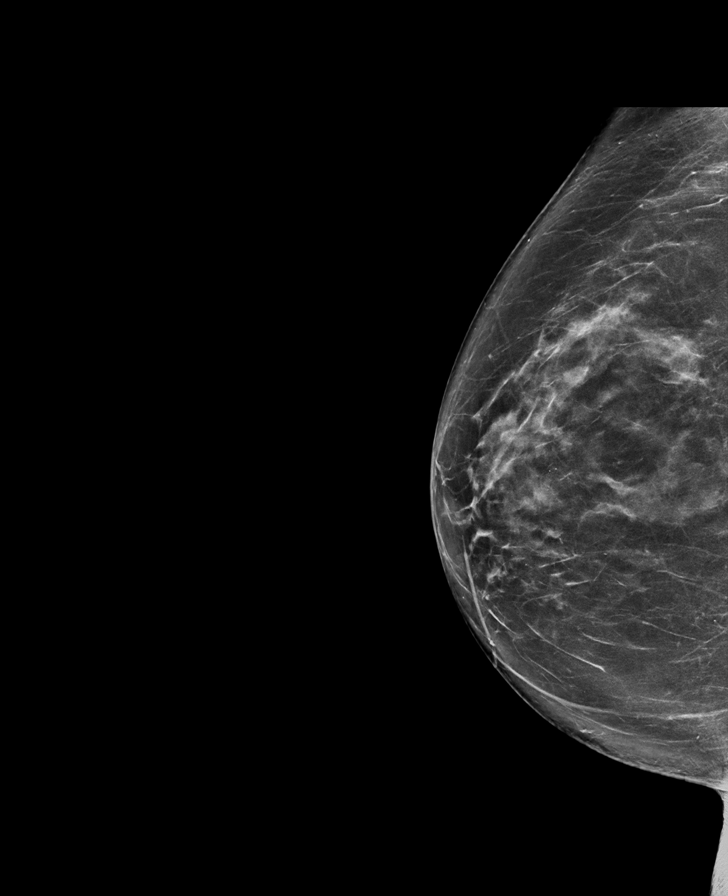

[L CC tomo · tomo slice 43/85.0]
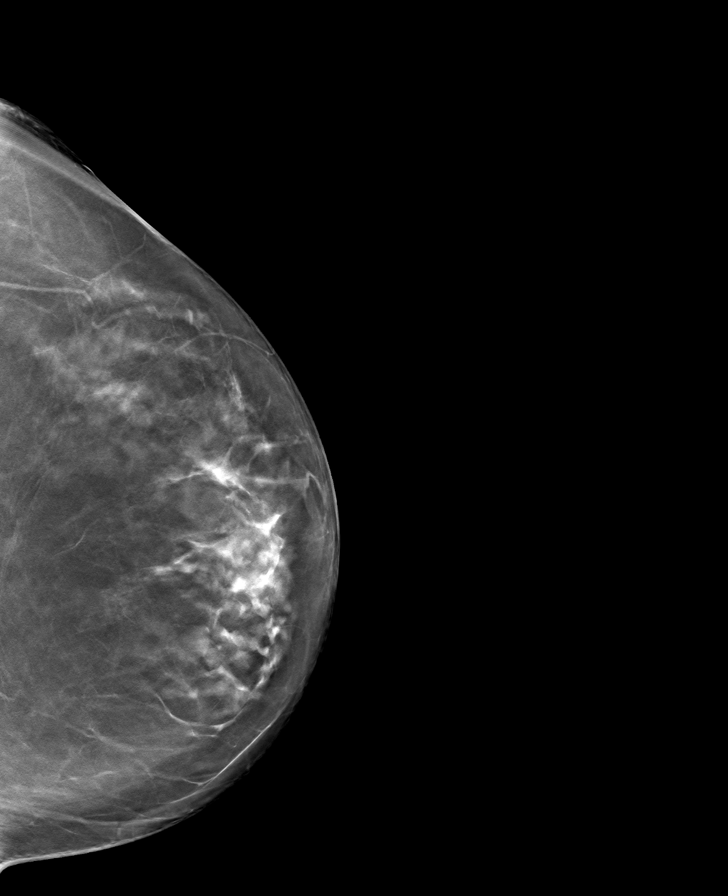

[R CC tomo · tomo slice 42/83.0]
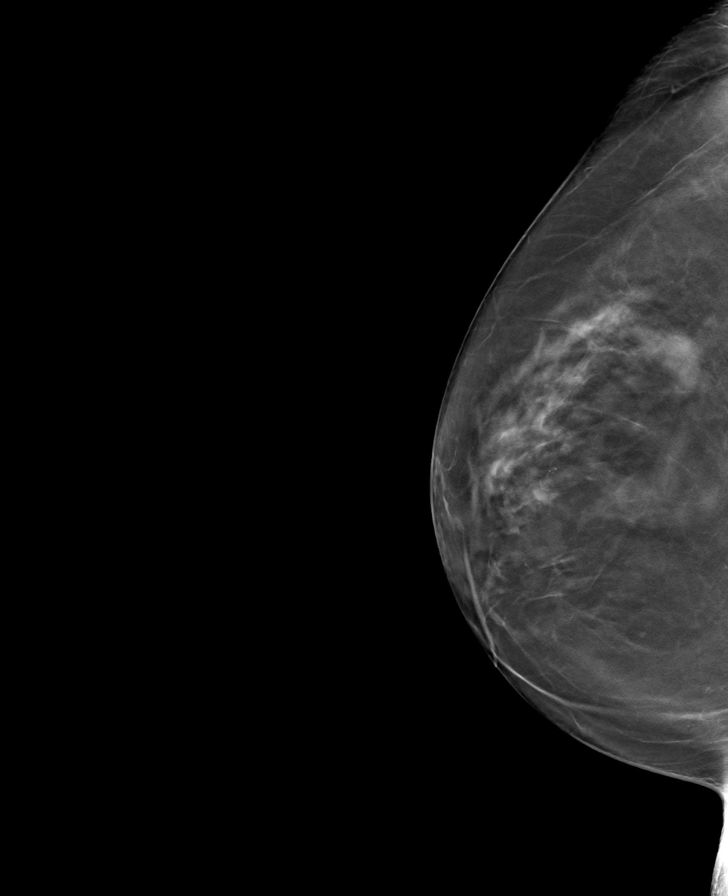

[L MLO tomo · tomo slice 42/83.0]
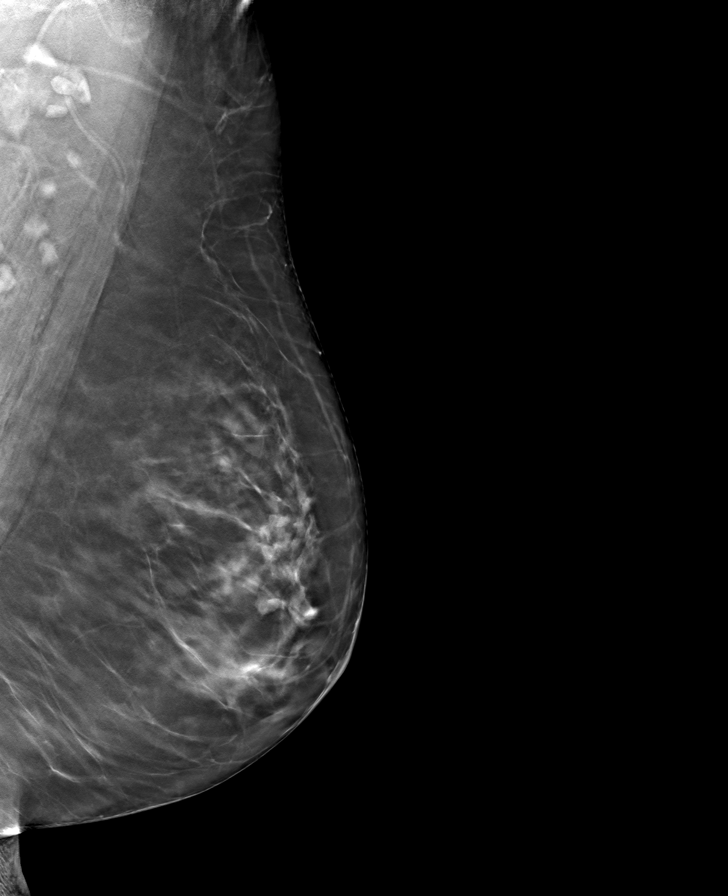

[R MLO tomo · tomo slice 39/77.0]
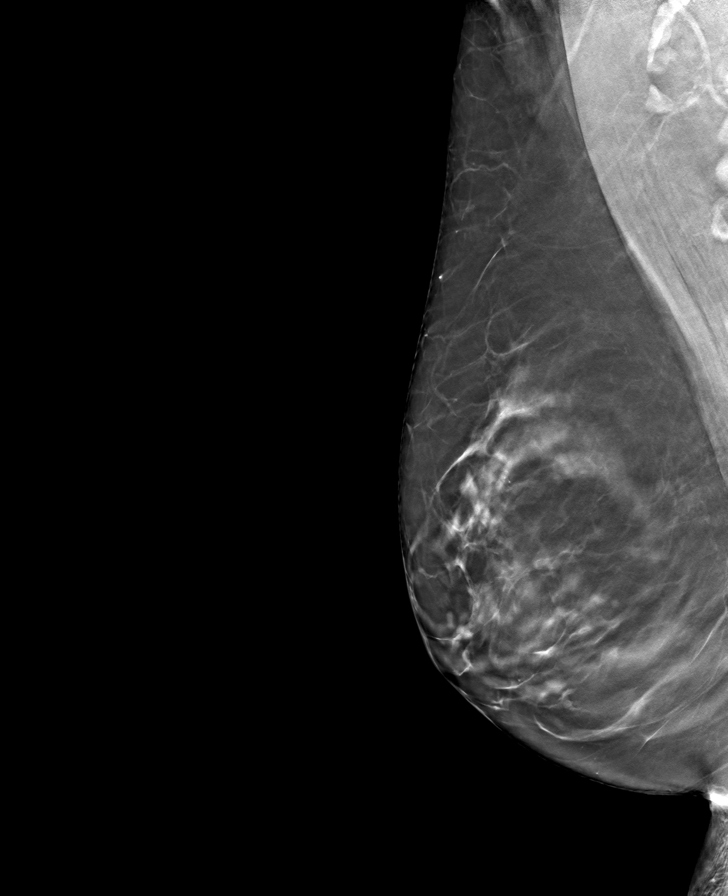

[8 of 24 positions shown; findings below may reference images not displayed]

ACR Breast Density Category c: The breast tissue is heterogeneously
dense, which may obscure small masses.
FINDINGS: In the left breast, a possible mass warrants further evaluation. In
the right breast, no findings suspicious for malignancy.
IMPRESSION: Further evaluation is suggested for a possible mass in the left
breast.

RECOMMENDATION:
Diagnostic mammogram and possibly ultrasound of the left breast.
(Code:C3-P-XXB)

The patient will be contacted regarding the findings, and additional
imaging will be scheduled.

BI-RADS CATEGORY  0: Incomplete. Need additional imaging evaluation
and/or prior mammograms for comparison.

## 2021-01-22 ENCOUNTER — Other Ambulatory Visit: Payer: Self-pay | Admitting: Family Medicine

## 2021-01-22 DIAGNOSIS — R928 Other abnormal and inconclusive findings on diagnostic imaging of breast: Secondary | ICD-10-CM

## 2021-01-29 DIAGNOSIS — L821 Other seborrheic keratosis: Secondary | ICD-10-CM | POA: Diagnosis not present

## 2021-01-29 DIAGNOSIS — C44729 Squamous cell carcinoma of skin of left lower limb, including hip: Secondary | ICD-10-CM | POA: Diagnosis not present

## 2021-01-29 DIAGNOSIS — C44519 Basal cell carcinoma of skin of other part of trunk: Secondary | ICD-10-CM | POA: Diagnosis not present

## 2021-01-29 DIAGNOSIS — D485 Neoplasm of uncertain behavior of skin: Secondary | ICD-10-CM | POA: Diagnosis not present

## 2021-01-29 DIAGNOSIS — L738 Other specified follicular disorders: Secondary | ICD-10-CM | POA: Diagnosis not present

## 2021-02-14 ENCOUNTER — Ambulatory Visit
Admission: RE | Admit: 2021-02-14 | Discharge: 2021-02-14 | Disposition: A | Discharge status: Home / Self Care | Payer: Medicare Other | Source: Ambulatory Visit | Attending: Family Medicine | Admitting: Family Medicine

## 2021-02-14 ENCOUNTER — Other Ambulatory Visit: Payer: Self-pay

## 2021-02-14 DIAGNOSIS — R928 Other abnormal and inconclusive findings on diagnostic imaging of breast: Secondary | ICD-10-CM | POA: Diagnosis not present

## 2021-02-14 DIAGNOSIS — R922 Inconclusive mammogram: Secondary | ICD-10-CM | POA: Diagnosis not present

## 2021-02-14 IMAGING — US US BREAST*L* LIMITED INC AXILLA
1 series · 3 of 3 positions shown · non-contrast
Comparison: Previous exam(s).

CLINICAL DATA: Patient was recalled from screening mammogram for a
possible mass in the left breast.

EXAM:
ULTRASOUND LEFT BREAST LIMITED; DIGITAL DIAGNOSTIC UNILATERAL LEFT
MAMMOGRAM WITH TOMOSYNTHESIS AND CAD
ULTRASOUND OF THE LEFT BREAST
TECHNIQUE: Targeted ultrasound examination of the left breast was performed.;
Left digital diagnostic mammography and breast tomosynthesis was
performed. The images were evaluated with computer-aided detection.

[Series 1: us breast*left* limited inc axilla · 0.06mm/px · 3 of 3 slices shown]
[im 1/3]
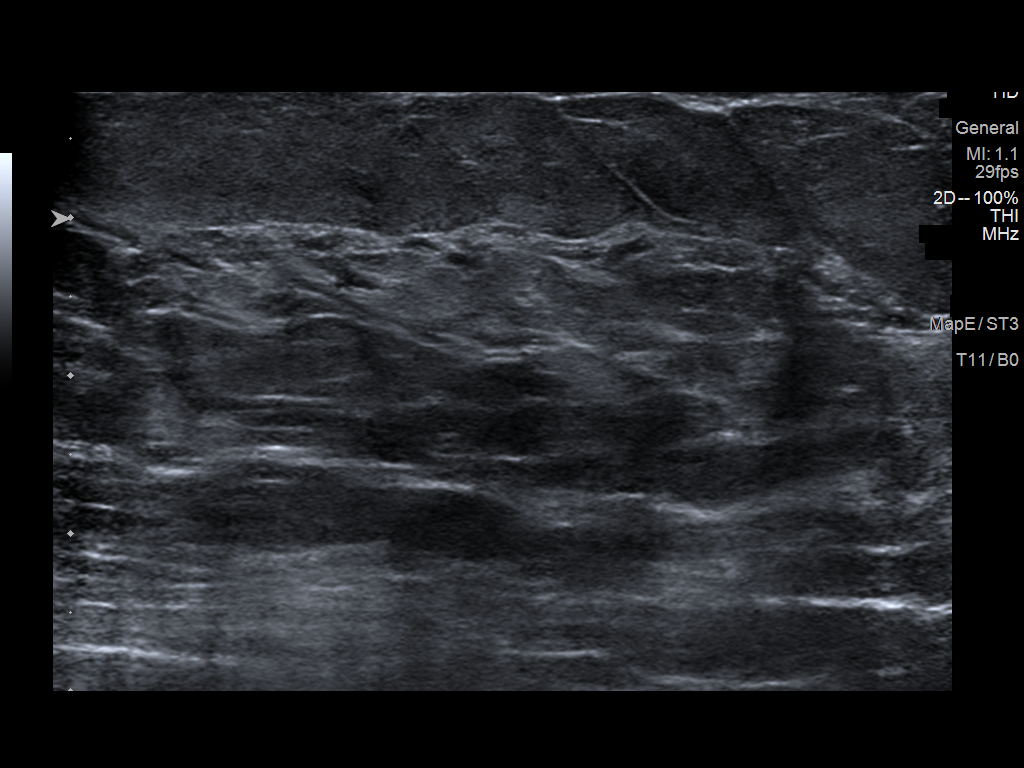
[im 2/3]
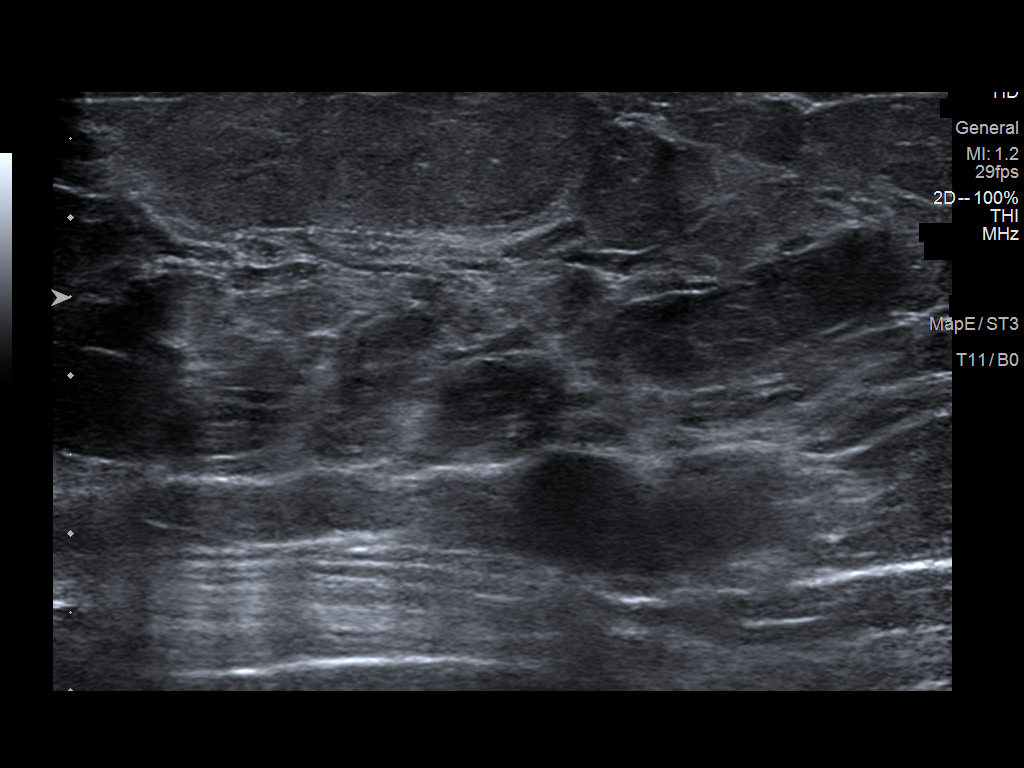
[im 3/3]
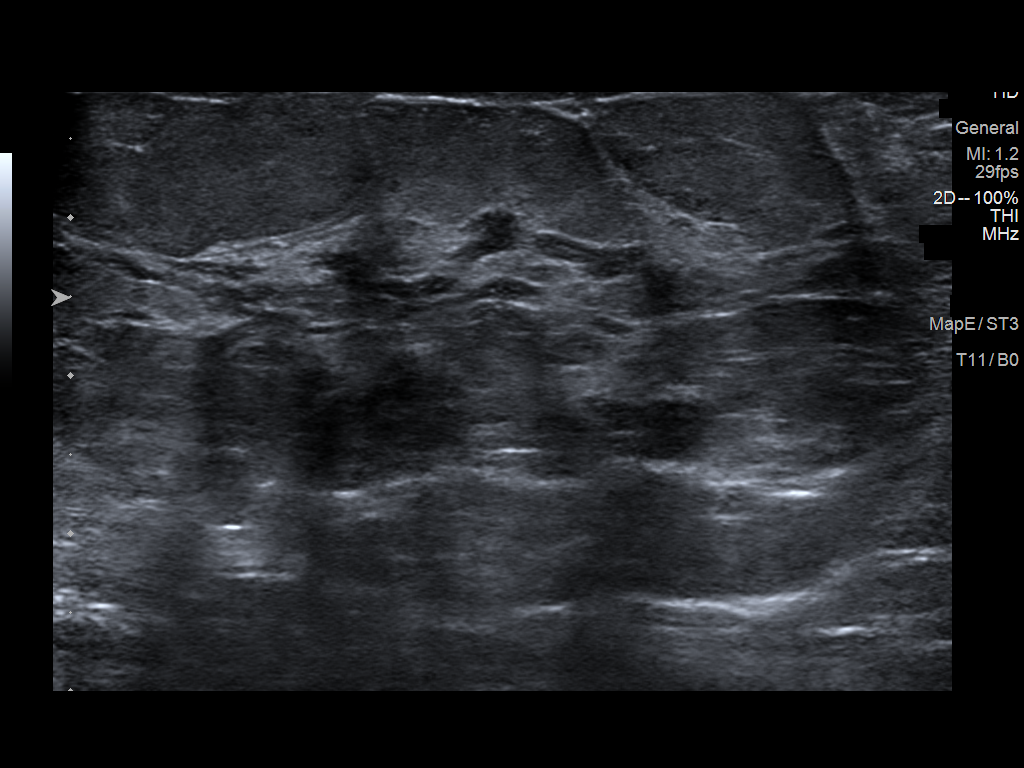

[3 of 3 positions shown; findings below may reference images not displayed]

ACR Breast Density Category c: The breast tissue is heterogeneously
dense, which may obscure small masses.
FINDINGS: Additional imaging evaluation of the left breast was performed.
There is no persistent mass, distortion or malignant type
microcalcifications.

On physical exam, I do not palpate a mass in the retroareolar region
of the left breast.

Targeted ultrasound is performed, showing mildly prominent ducts
with no intraductal mass in the retroareolar region of the left
breast. No solid or cystic mass, abnormal shadowing or distortion
visualized.
IMPRESSION: No evidence of malignancy in the left breast.

RECOMMENDATION:
Bilateral screening mammogram in 1 year is recommended.

I have discussed the findings and recommendations with the patient.
If applicable, a reminder letter will be sent to the patient
regarding the next appointment.

BI-RADS CATEGORY  1: Negative.

## 2021-02-14 IMAGING — MG MM DIGITAL DIAGNOSTIC UNILAT*L* W/ TOMO W/ CAD
6 series · 6 of 18 positions shown · non-contrast
Comparison: Previous exam(s).

CLINICAL DATA: Patient was recalled from screening mammogram for a
possible mass in the left breast.

EXAM:
ULTRASOUND LEFT BREAST LIMITED; DIGITAL DIAGNOSTIC UNILATERAL LEFT
MAMMOGRAM WITH TOMOSYNTHESIS AND CAD
ULTRASOUND OF THE LEFT BREAST
TECHNIQUE: Targeted ultrasound examination of the left breast was performed.;
Left digital diagnostic mammography and breast tomosynthesis was
performed. The images were evaluated with computer-aided detection.

[L MLO synth-2D (1 of 2)]
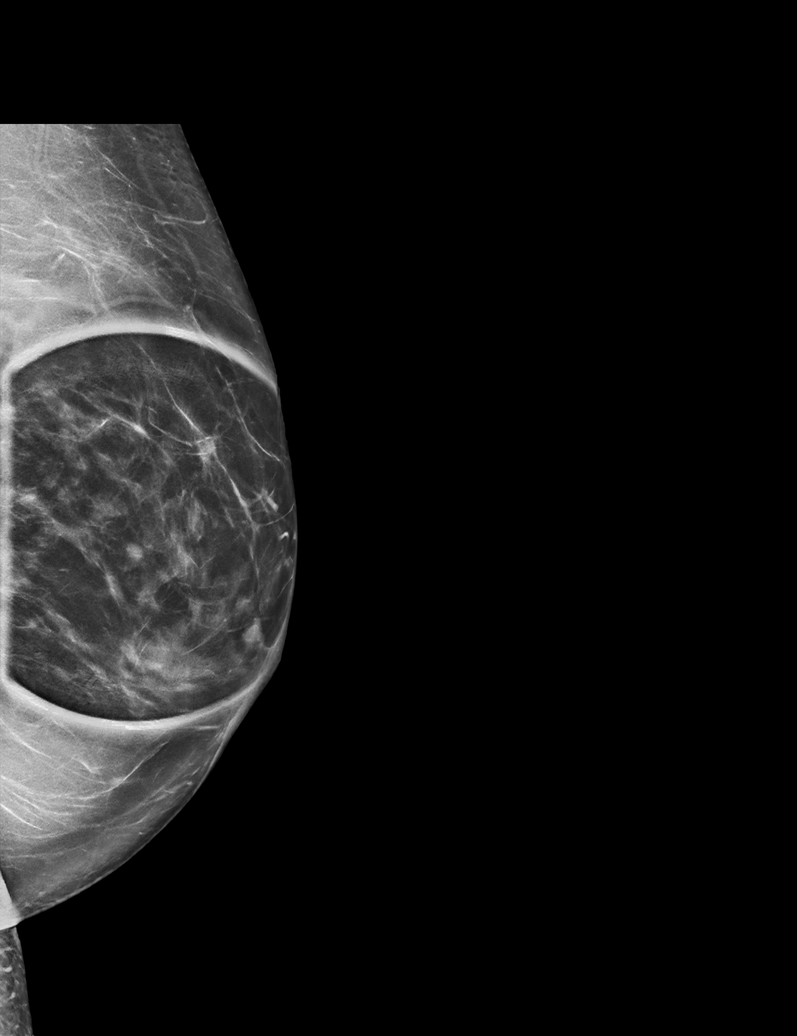

[L MLO synth-2D (2 of 2)]
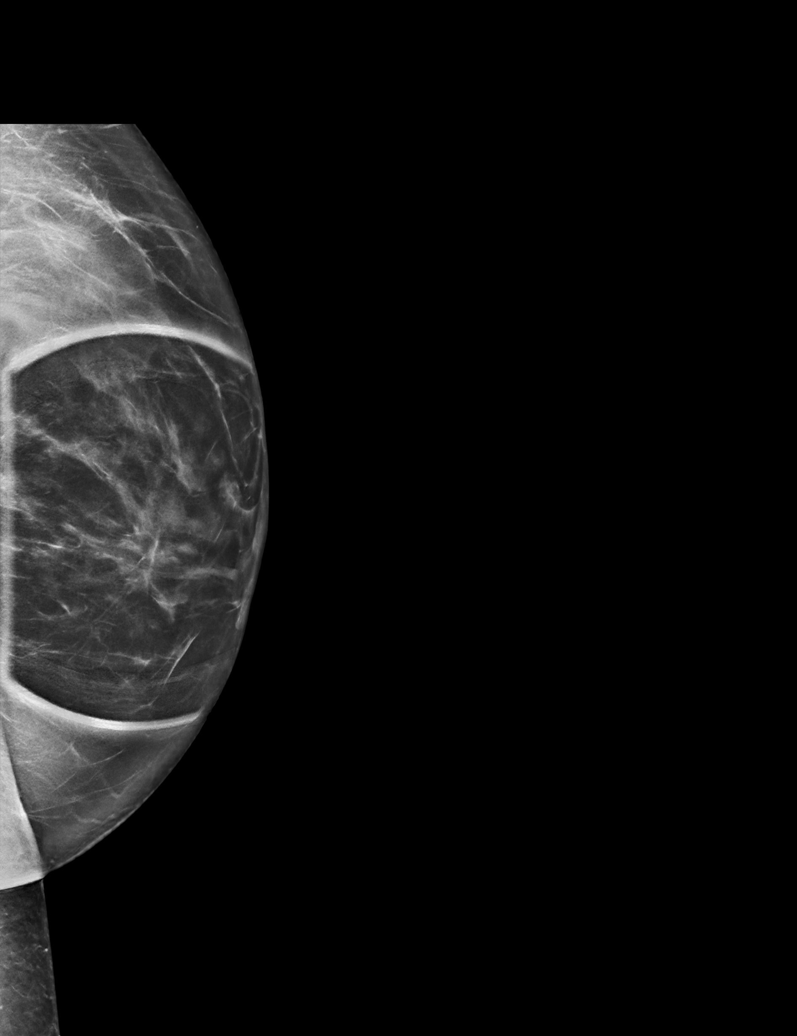

[L CC synth-2D]
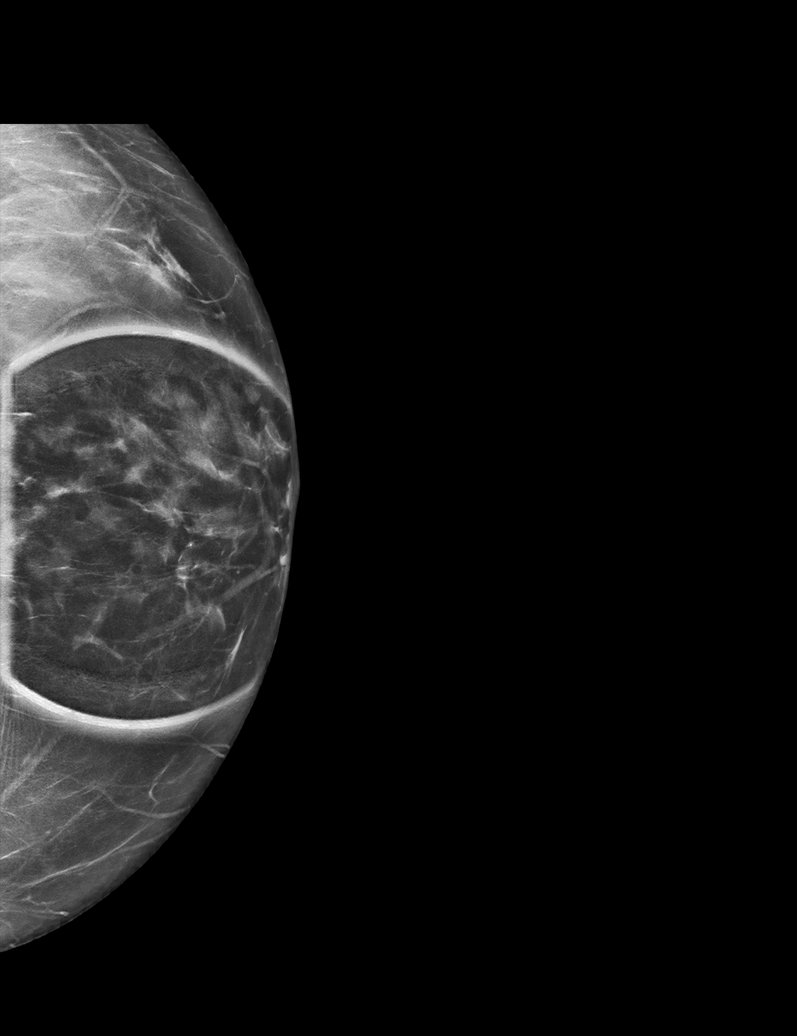

[L MLO tomo (1 of 2) · tomo slice 35/69.0]
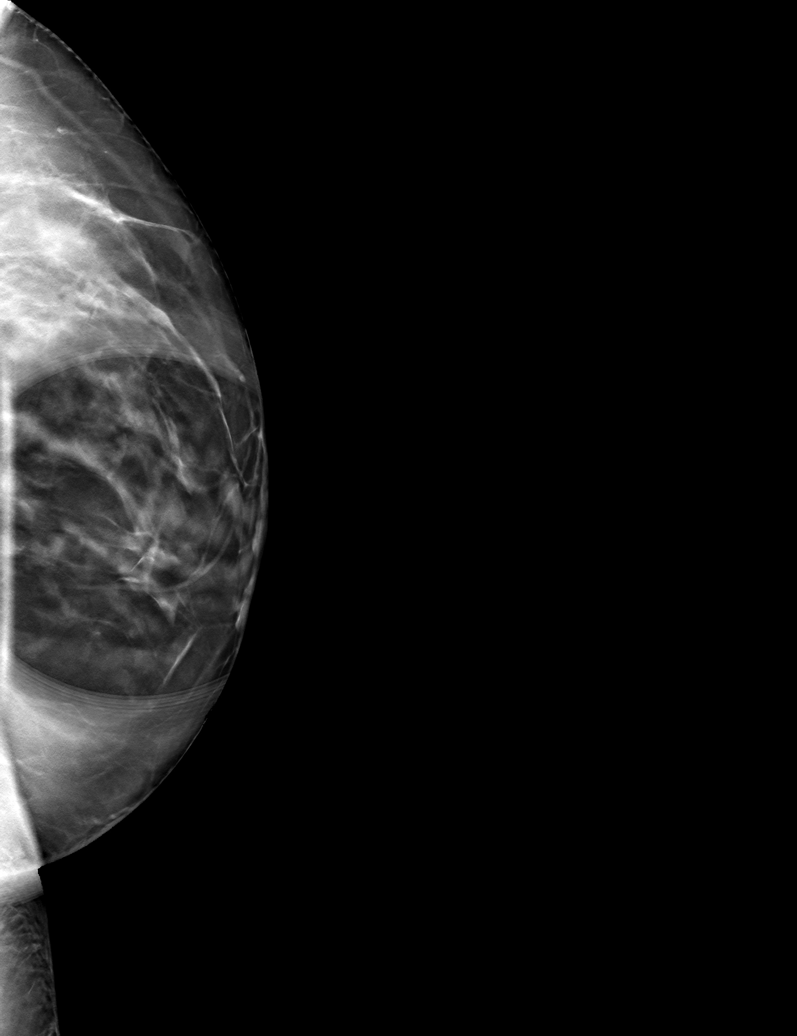

[L CC tomo · tomo slice 33/64.0]
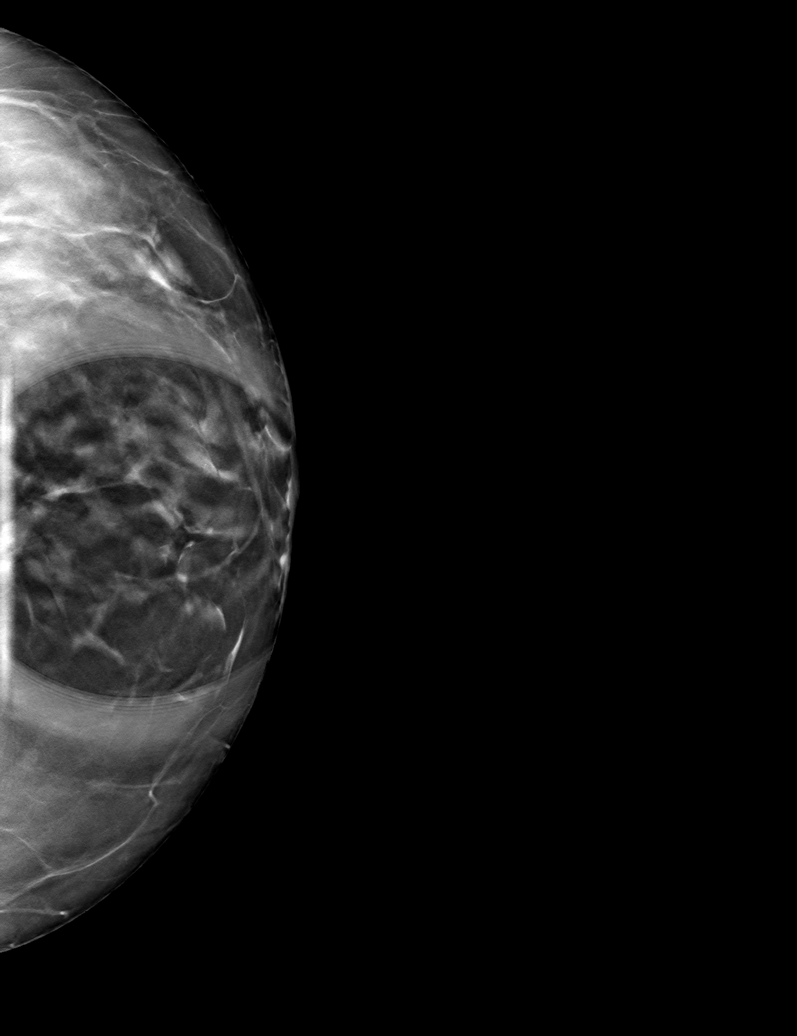

[L MLO tomo (2 of 2) · tomo slice 35/69.0]
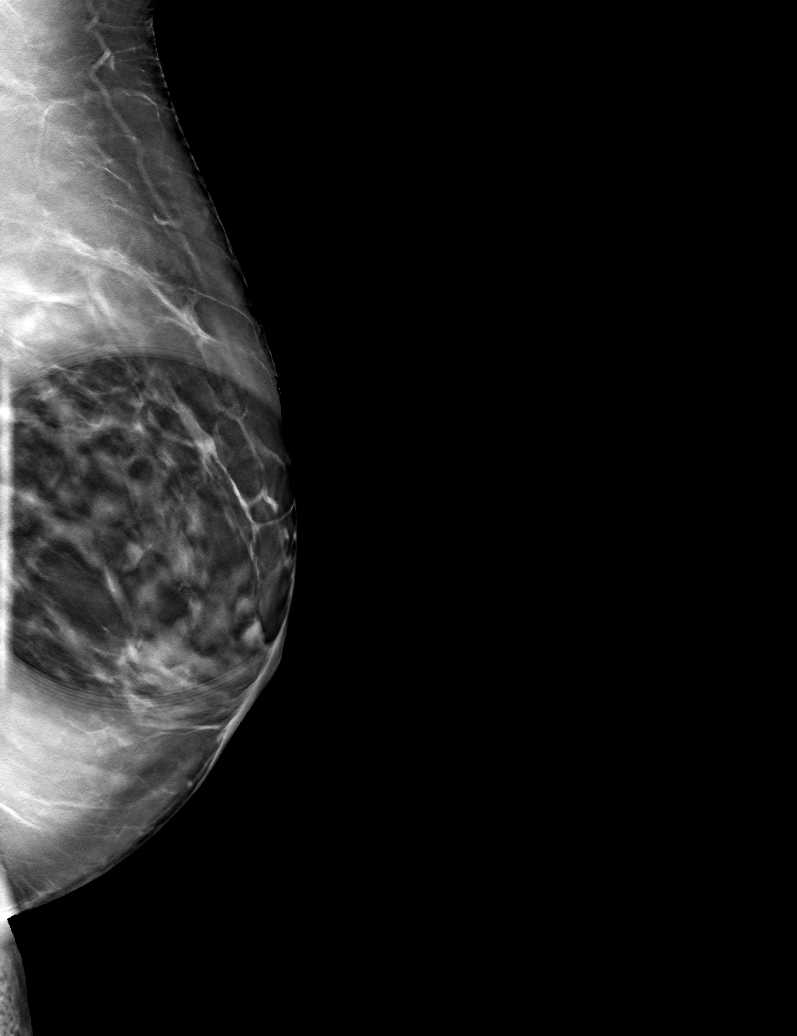

[6 of 18 positions shown; findings below may reference images not displayed]

ACR Breast Density Category c: The breast tissue is heterogeneously
dense, which may obscure small masses.
FINDINGS: Additional imaging evaluation of the left breast was performed.
There is no persistent mass, distortion or malignant type
microcalcifications.

On physical exam, I do not palpate a mass in the retroareolar region
of the left breast.

Targeted ultrasound is performed, showing mildly prominent ducts
with no intraductal mass in the retroareolar region of the left
breast. No solid or cystic mass, abnormal shadowing or distortion
visualized.
IMPRESSION: No evidence of malignancy in the left breast.

RECOMMENDATION:
Bilateral screening mammogram in 1 year is recommended.

I have discussed the findings and recommendations with the patient.
If applicable, a reminder letter will be sent to the patient
regarding the next appointment.

BI-RADS CATEGORY  1: Negative.

## 2021-02-28 ENCOUNTER — Telehealth: Payer: Self-pay | Admitting: Neurology

## 2021-02-28 NOTE — Telephone Encounter (Signed)
I called the administrative review team at [place] to discuss a potential tier reduction for Ubrelvy.

## 2021-02-28 NOTE — Telephone Encounter (Signed)
I waited on the phone for an extensive amount of time and did not receive an answer.

## 2021-02-28 NOTE — Telephone Encounter (Signed)
Pt called stating she did not get her Ubrogepant (UBRELVY) 100 MG TABS refilled due to the fact that it was going to cost her $1,000.00. she just wanted to inform the doctor.

## 2021-03-01 NOTE — Telephone Encounter (Signed)
Called [facility] pharmacy.  They ran the prescription for 10 tablets and the cost was still a little over $700.  Questionable deductible issue?  I called the patient and encouraged her to reach out to her insurance company to investigate why the cost is so high.  Also let her know that I tried to contact her insurance yesterday and waited on the phone for a long time but did not catch anyone.  I let her know I would call again and and requested that we update each other with our findings.  Potential options: patient can find out why cost is high; request a tier exception; patient can apply for [facility] assist.

## 2021-03-06 NOTE — Telephone Encounter (Signed)
Patient called the office to report that [place] told her the medication is tier 4 and they will pay 50%.  She prefers to wait until January to check alternative plans and reduced cost for [place]. She currently uses 2 Excedrin Migraine to treat migraine which is working.  I discussed risk for rebound headache and advised not to take this medication more than 2 days/week.  We can see if a tier reduction request is possible and patient is agreeable.  The patient provided the provider line for [facility] [phone].

## 2021-03-06 NOTE — Telephone Encounter (Signed)
Pt called, will call [employer] to find out why the cost is so high for Urbrelvy. If find out anything will let you know.  I(Annie) informed her of previous telephone note.

## 2021-03-07 ENCOUNTER — Telehealth: Payer: Self-pay | Admitting: *Deleted

## 2021-03-07 NOTE — Telephone Encounter (Signed)
Inititated thru [facility] Contact plan to follow up on BCYPTYKV in 5 days.

## 2021-03-08 NOTE — Telephone Encounter (Signed)
Initiated CMM:  03-07-21.  Karen Bass (Key: Gwinda Passe) [name] '100MG'$  tablet  Form [employer] Tiering Exception Request Form  Plan Contact [phone] phone Determination pending (could be up to 5 days).

## 2021-03-28 ENCOUNTER — Ambulatory Visit: Payer: Medicare Other | Admitting: Adult Health

## 2021-04-24 DIAGNOSIS — H2513 Age-related nuclear cataract, bilateral: Secondary | ICD-10-CM | POA: Diagnosis not present

## 2021-04-24 DIAGNOSIS — H18413 Arcus senilis, bilateral: Secondary | ICD-10-CM | POA: Diagnosis not present

## 2021-04-24 DIAGNOSIS — H25013 Cortical age-related cataract, bilateral: Secondary | ICD-10-CM | POA: Diagnosis not present

## 2021-04-24 DIAGNOSIS — H25043 Posterior subcapsular polar age-related cataract, bilateral: Secondary | ICD-10-CM | POA: Diagnosis not present

## 2021-04-24 DIAGNOSIS — H2512 Age-related nuclear cataract, left eye: Secondary | ICD-10-CM | POA: Diagnosis not present

## 2021-04-30 DIAGNOSIS — J0140 Acute pansinusitis, unspecified: Secondary | ICD-10-CM | POA: Diagnosis not present

## 2021-04-30 DIAGNOSIS — R0981 Nasal congestion: Secondary | ICD-10-CM | POA: Diagnosis not present

## 2021-06-10 DIAGNOSIS — J101 Influenza due to other identified influenza virus with other respiratory manifestations: Secondary | ICD-10-CM | POA: Diagnosis not present

## 2021-07-10 DIAGNOSIS — N3001 Acute cystitis with hematuria: Secondary | ICD-10-CM | POA: Diagnosis not present

## 2021-07-10 DIAGNOSIS — R300 Dysuria: Secondary | ICD-10-CM | POA: Diagnosis not present

## 2021-07-21 DIAGNOSIS — N3000 Acute cystitis without hematuria: Secondary | ICD-10-CM | POA: Diagnosis not present

## 2021-09-07 DIAGNOSIS — H2512 Age-related nuclear cataract, left eye: Secondary | ICD-10-CM | POA: Diagnosis not present

## 2021-09-07 DIAGNOSIS — H2511 Age-related nuclear cataract, right eye: Secondary | ICD-10-CM | POA: Diagnosis not present

## 2021-10-05 DIAGNOSIS — H2511 Age-related nuclear cataract, right eye: Secondary | ICD-10-CM | POA: Diagnosis not present

## 2021-11-06 DIAGNOSIS — R7303 Prediabetes: Secondary | ICD-10-CM | POA: Diagnosis not present

## 2021-11-06 DIAGNOSIS — R0602 Shortness of breath: Secondary | ICD-10-CM | POA: Diagnosis not present

## 2021-11-06 DIAGNOSIS — I10 Essential (primary) hypertension: Secondary | ICD-10-CM | POA: Diagnosis not present

## 2021-11-06 DIAGNOSIS — R5383 Other fatigue: Secondary | ICD-10-CM | POA: Diagnosis not present

## 2021-11-06 DIAGNOSIS — R079 Chest pain, unspecified: Secondary | ICD-10-CM | POA: Diagnosis not present

## 2021-11-19 ENCOUNTER — Encounter: Payer: Self-pay | Admitting: Cardiology

## 2021-11-19 ENCOUNTER — Ambulatory Visit: Payer: Medicare Other | Admitting: Cardiology

## 2021-11-19 VITALS — BP 135/85 | HR 89 | Temp 98.4°F | Resp 16 | Ht 66.0 in | Wt 166.2 lb

## 2021-11-19 DIAGNOSIS — R072 Precordial pain: Secondary | ICD-10-CM | POA: Diagnosis not present

## 2021-11-19 DIAGNOSIS — E7800 Pure hypercholesterolemia, unspecified: Secondary | ICD-10-CM | POA: Diagnosis not present

## 2021-11-19 DIAGNOSIS — R0609 Other forms of dyspnea: Secondary | ICD-10-CM | POA: Diagnosis not present

## 2021-11-19 NOTE — Progress Notes (Signed)
? ?Primary Physician/Referring:  Hulan Fess, MD ? ?Patient ID: Karen Bass, female    DOB: September 11, 1955, 66 y.o.   MRN: [id number] ? ?Chief Complaint  ?Patient presents with  ? Chest Pain  ? Shortness of Breath  ? New Patient (Initial Visit)  ?  Referred by Genelle Bal, FNP  ? ?HPI:   ? ?Karen Bass  is a 66 y.o. Caucasian female patient with history of mild hyperlipidemia and no history of hypertension, diabetes mellitus, tobacco use referred to me for evaluation of recent onset dyspnea on exertion and chest pain.  Patient states that about a year ago had gone to the mountains and while climbing up, she had chest tightness with radiation toward her neck and associated with marked dyspnea and tach to constantly stop.  She had had similar experience about 3 weeks ago when she was climbing up hill.  ? ?PND and orthopnea, no hemoptysis, no leg edema or painful swelling of the lower extremity. ? ?Past Medical History:  ?Diagnosis Date  ? COVID-19 07/07/2019 and 07/15/2020  ? has had both vaccines   ? Fibrocystic breast disease   ? History of migraine   ? Hyperlipidemia   ? Hyperparathyroidism ([place])   ? history of solitary parathyroidectomy in 1985  ? Osteopenia   ? with bone density testing in March of 2014 with T score -1.6 in the femur  ? ?Past Surgical History:  ?Procedure Laterality Date  ? APPENDECTOMY    ? BREAST BIOPSY    ? BREAST EXCISIONAL BIOPSY Right   ? BTL  2003  ? ovarian cyst removal/unilateral oophorectomy    ? PARATHYROIDECTOMY    ? TONSILLECTOMY AND ADENOIDECTOMY    ? WISDOM TOOTH EXTRACTION    ? ?Family History  ?Problem Relation Age of Onset  ? Breast cancer Mother 26  ?     postmenopausal breast cancer in her 105s  ? Dementia Mother   ? Basal cell carcinoma Mother   ? Thyroid disease Father   ? Hypertension Father   ? Other Father   ?     PMS, blood clot, elevated sugar   ? High Cholesterol Brother   ?     high triglycerides   ? Allergic rhinitis Brother   ? Heart Problems Brother   ? Throat  cancer Brother   ? Hypertension Brother   ? Dementia Maternal Grandmother   ? Colon cancer Neg Hx   ? Colon polyps Neg Hx   ? Liver disease Neg Hx   ? Migraines Neg Hx   ? Headache Neg Hx   ?  ?Social History  ? ?Tobacco Use  ? Smoking status: Never  ? Smokeless tobacco: Never  ?Substance Use Topics  ? Alcohol use: Never  ? ?Marital Status: Widowed  ?ROS  ?Review of Systems  ?Cardiovascular:  Positive for chest pain and dyspnea on exertion. Negative for leg swelling.  ?Objective  ?Blood pressure 135/85, pulse 89, temperature 98.4 ?F (36.9 ?C), temperature source Temporal, resp. rate 16, height '5\' 6"'  (1.676 m), weight 166 lb 3.2 oz (75.4 kg), SpO2 95 %. Body mass index is 26.83 kg/m?.  ? ?  11/19/2021  ?  1:40 PM 09/13/2020  ? 11:27 AM  ?Vitals with BMI  ?Height '5\' 6"'  '5\' 6"'   ?Weight 166 lbs 3 oz 177 lbs  ?BMI 26.84 28.58  ?Systolic 109 323  ?Diastolic 85 91  ?Pulse 89 95  ?  ?Physical Exam ?Neck:  ?   Vascular: No JVD.  ?  Cardiovascular:  ?   Rate and Rhythm: Normal rate and regular rhythm.  ?   Pulses: Intact distal pulses.  ?   Heart sounds: Normal heart sounds. No murmur heard. ?  No gallop.  ?Pulmonary:  ?   Effort: Pulmonary effort is normal.  ?   Breath sounds: Normal breath sounds.  ?Abdominal:  ?   General: Bowel sounds are normal.  ?   Palpations: Abdomen is soft.  ?Musculoskeletal:  ?   Right lower leg: No edema.  ?   Left lower leg: No edema.  ? ? ?Medications and allergies  ? ?Allergies  ?Allergen Reactions  ? Tetracycline   ?  Other reaction(s): insomnia  ?  ? ?Medication list after today's encounter  ? ?Current Outpatient Medications:  ?  Acetaminophen (ACETAMINOPHEN EXTRA STRENGTH) 500 MG capsule, Take 1 tablet by mouth as needed., Disp: , Rfl:  ?  alendronate (FOSAMAX) 35 MG tablet, Take 1 tablet by mouth once a week., Disp: , Rfl:  ?  OVER THE COUNTER MEDICATION, as needed. CORE ONE MIGRAINE RELIEF, Disp: , Rfl:  ?  aspirin 81 MG chewable tablet, Chew 81 mg by mouth daily. (Patient not taking: Reported  on 11/19/2021), Disp: , Rfl:  ? ?Laboratory examination:  ? ?External labs:  ? ?Labs 12/19/2020: ? ?Hb 13.7/HCT 40.6, platelets 270, normal indicis. ? ?Serum glucose 88 mg, BUN 28, creatinine 1.01, EGFR 61 MS, LFTs normal. ? ?Total cholesterol 182, triglycerides 157, HDL 45, LDL 110.  Non-HDL cholesterol 137. ? ?Radiology:  ? ? ?Cardiac Studies:  ? ?EKG:  ? ?EKG 11/06/2021: Normal sinus rhythm with rate of 90 bpm. ? ?Assessment  ? ?  ICD-10-CM   ?1. Precordial chest pain  R07.2 PCV ECHOCARDIOGRAM COMPLETE  ?  PCV MYOCARDIAL PERFUSION WO LEXISCAN  ?  CT CARDIAC SCORING ([facility] LOCATIONS ONLY)  ?  CANCELED: EKG 12-Lead  ?  ?2. Dyspnea on exertion  R06.09   ?  ?3. Hypercholesteremia  E78.00   ?  ?  ? ?Medications Discontinued During This Encounter  ?Medication Reason  ? Cholecalciferol (VITAMIN D) 125 MCG (5000 UT) CAPS   ? CORAL CALCIUM PO   ? Flaxseed, Linseed, (FLAX SEED OIL PO)   ? Omega-3 Fatty Acids (FISH OIL) 1000 MG CAPS   ? Ubrogepant (UBRELVY) 100 MG TABS   ?  ?No orders of the defined types were placed in this encounter. ? ?Orders Placed This Encounter  ?Procedures  ? CT CARDIAC SCORING ([facility] LOCATIONS ONLY)  ?  Wt 162 ? No Spinal Stimulator/ no body injector/ no glucose or heart monitor ? no Special needs ?No Heart Sx ?Self pay $99/ pt is aware ?Pt is aware of $75 no show fee ?epic order/ s/w pt/ LM ? ?No caffeine 24 hrs prior/ no heavy or strenuous activity 6 hrs prior  ?  Standing Status:   Future  ?  Standing Expiration Date:   01/19/2022  ?  Order Specific Question:   Preferred imaging location?  ?  Answer:   [facility]  ? PCV MYOCARDIAL PERFUSION WO LEXISCAN  ?  Standing Status:   Future  ?  Standing Expiration Date:   01/19/2022  ? PCV ECHOCARDIOGRAM COMPLETE  ?  Standing Status:   Future  ?  Standing Expiration Date:   11/20/2022  ? ?Recommendations:  ? ?Karen Bass is a 66 y.o. Caucasian female patient with history of mild hyperlipidemia and no history of hypertension, diabetes mellitus, tobacco use referred  to me for  evaluation of recent onset dyspnea on exertion and chest pain.  Patient states that about a year ago had gone to the mountains and while climbing up, she had chest tightness with radiation toward her neck and associated with marked dyspnea and tach to constantly stop.  She had had similar experience about 3 weeks ago when she was climbing up hill.  Symptoms are suggestive of class II angina pectoris. ? ?Continue aspirin 81 mg daily, I do not add any medications, I will set her up for coronary calcium score, exercise nuclear stress test and an echocardiogram and see her back after this.  She does have mild hyperlipidemia, depending upon the test results, will consider statin therapy. ? ? ? ? ?Adrian Prows, MD, [facility] ?11/20/2021, 5:04 AM ?Office: [phone] ?

## 2021-12-10 ENCOUNTER — Ambulatory Visit: Payer: Medicare Other

## 2021-12-10 DIAGNOSIS — R072 Precordial pain: Secondary | ICD-10-CM | POA: Diagnosis not present

## 2021-12-11 ENCOUNTER — Other Ambulatory Visit: Payer: Medicare Other

## 2021-12-11 LAB — PCV MYOCARDIAL PERFUSION WO LEXISCAN
Base ST Depression (mm): 0 mm
ST Depression (mm): 0 mm

## 2021-12-17 DIAGNOSIS — J0100 Acute maxillary sinusitis, unspecified: Secondary | ICD-10-CM | POA: Diagnosis not present

## 2021-12-18 DIAGNOSIS — N1831 Chronic kidney disease, stage 3a: Secondary | ICD-10-CM | POA: Diagnosis not present

## 2021-12-18 DIAGNOSIS — E7800 Pure hypercholesterolemia, unspecified: Secondary | ICD-10-CM | POA: Diagnosis not present

## 2021-12-18 DIAGNOSIS — Z23 Encounter for immunization: Secondary | ICD-10-CM | POA: Diagnosis not present

## 2021-12-18 DIAGNOSIS — I10 Essential (primary) hypertension: Secondary | ICD-10-CM | POA: Diagnosis not present

## 2021-12-18 DIAGNOSIS — Z0000 Encounter for general adult medical examination without abnormal findings: Secondary | ICD-10-CM | POA: Diagnosis not present

## 2021-12-18 DIAGNOSIS — Z1389 Encounter for screening for other disorder: Secondary | ICD-10-CM | POA: Diagnosis not present

## 2021-12-19 ENCOUNTER — Ambulatory Visit: Payer: Medicare Other

## 2021-12-19 DIAGNOSIS — R072 Precordial pain: Secondary | ICD-10-CM

## 2021-12-28 ENCOUNTER — Ambulatory Visit
Admission: RE | Admit: 2021-12-28 | Discharge: 2021-12-28 | Disposition: A | Discharge status: Home / Self Care | Payer: No Typology Code available for payment source | Source: Ambulatory Visit | Attending: Cardiology | Admitting: Cardiology

## 2021-12-28 DIAGNOSIS — R072 Precordial pain: Secondary | ICD-10-CM

## 2021-12-28 DIAGNOSIS — E785 Hyperlipidemia, unspecified: Secondary | ICD-10-CM | POA: Diagnosis not present

## 2022-01-03 ENCOUNTER — Encounter: Payer: Self-pay | Admitting: Cardiology

## 2022-01-03 ENCOUNTER — Ambulatory Visit: Payer: Medicare Other | Admitting: Cardiology

## 2022-01-03 VITALS — BP 131/82 | HR 90 | Temp 98.2°F | Resp 16 | Ht 66.0 in | Wt 163.0 lb

## 2022-01-03 DIAGNOSIS — R0609 Other forms of dyspnea: Secondary | ICD-10-CM

## 2022-01-03 DIAGNOSIS — E7800 Pure hypercholesterolemia, unspecified: Secondary | ICD-10-CM | POA: Diagnosis not present

## 2022-01-03 DIAGNOSIS — R072 Precordial pain: Secondary | ICD-10-CM

## 2022-01-03 NOTE — Progress Notes (Signed)
Primary Physician/Referring:  Kristen Loader, FNP  Patient ID: Karen Bass, female    DOB: [birth year], 66 y.o.   MRN: [id number]  Chief Complaint  Patient presents with   Results   Follow-up   HPI:    Karen Bass  is a 66 y.o. Caucasian female patient with history of mild hyperlipidemia and no history of hypertension, diabetes mellitus referred to me for evaluation of recent onset dyspnea on exertion and chest pain.  Patient states that about a year ago had gone to the mountains and while climbing up, she had chest tightness with radiation toward her neck and associated with marked dyspnea and tach to constantly stop.  She had similar episode recently, and is with a suspicion for angina pectoris, she underwent echocardiogram and nuclear stress testing along with coronary calcium score and presents for follow-up.  Except for dyspnea on exertion no other symptoms, she has not had any further episodes of chest pain with radiation to her jaw.  No PND and orthopnea, no hemoptysis, no leg edema or painful swelling of the lower extremity.  Past Medical History:  Diagnosis Date   COVID-19 07/07/2019 and 07/15/2020   has had both vaccines    Fibrocystic breast disease    History of migraine    Hyperlipidemia    Hyperparathyroidism ([place])    history of solitary parathyroidectomy in 1985   Osteopenia    with bone density testing in March of 2014 with T score -1.6 in the femur   Past Surgical History:  Procedure Laterality Date   APPENDECTOMY     BREAST BIOPSY     BREAST EXCISIONAL BIOPSY Right    BTL  2003   ovarian cyst removal/unilateral oophorectomy     PARATHYROIDECTOMY     TONSILLECTOMY AND ADENOIDECTOMY     WISDOM TOOTH EXTRACTION     Family History  Problem Relation Age of Onset   Breast cancer Mother 77       postmenopausal breast cancer in her 29s   Dementia Mother    Basal cell carcinoma Mother    Thyroid disease Father    Hypertension Father    Other Father         PMS, blood clot, elevated sugar    High Cholesterol Brother        high triglycerides    Allergic rhinitis Brother    Heart Problems Brother    Throat cancer Brother    Hypertension Brother    Dementia Maternal Grandmother    Colon cancer Neg Hx    Colon polyps Neg Hx    Liver disease Neg Hx    Migraines Neg Hx    Headache Neg Hx     Social History   Tobacco Use   Smoking status: Never   Smokeless tobacco: Never  Substance Use Topics   Alcohol use: Never   Marital Status: Widowed  ROS  Review of Systems  Cardiovascular:  Positive for chest pain and dyspnea on exertion. Negative for leg swelling.   Objective  Blood pressure 131/82, pulse 90, temperature 98.2 F (36.8 C), temperature source Temporal, resp. rate 16, height '5\' 6"'  (1.676 m), weight 163 lb (73.9 kg). Body mass index is 26.31 kg/m.     01/03/2022    2:13 PM 11/19/2021    1:40 PM 09/13/2020   11:27 AM  Vitals with BMI  Height '5\' 6"'  '5\' 6"'  '5\' 6"'   Weight 163 lbs 166 lbs 3 oz 177 lbs  BMI 26.32  46.96 29.52  Systolic 841 324 401  Diastolic 82 85 91  Pulse 90 89 95    Physical Exam Neck:     Vascular: No JVD.  Cardiovascular:     Rate and Rhythm: Normal rate and regular rhythm.     Pulses: Intact distal pulses.     Heart sounds: Normal heart sounds. No murmur heard.    No gallop.  Pulmonary:     Effort: Pulmonary effort is normal.     Breath sounds: Normal breath sounds.  Abdominal:     General: Bowel sounds are normal.     Palpations: Abdomen is soft.  Musculoskeletal:     Right lower leg: No edema.     Left lower leg: No edema.     Medications and allergies   Allergies  Allergen Reactions   Tetracycline     Other reaction(s): insomnia     Medication list after today's encounter   Current Outpatient Medications:    Acetaminophen (ACETAMINOPHEN EXTRA STRENGTH) 500 MG capsule, Take 1 tablet by mouth as needed., Disp: , Rfl:    alendronate (FOSAMAX) 35 MG tablet, Take 1 tablet by mouth once  a week., Disp: , Rfl:    melatonin 5 MG TABS, Take 5 mg by mouth at bedtime as needed., Disp: , Rfl:    OVER THE COUNTER MEDICATION, as needed. CORE ONE MIGRAINE RELIEF, Disp: , Rfl:   Laboratory examination:   External labs:   Labs 12/19/2020:  Hb 13.7/HCT 40.6, platelets 270, normal indicis.  Serum glucose 88 mg, BUN 28, creatinine 1.01, EGFR 61 MS, LFTs normal.  Total cholesterol 182, triglycerides 157, HDL 45, LDL 110.  Non-HDL cholesterol 137.  Radiology:    Cardiac Studies:   Coronary calcium score 12/29/2021: Coronary calcium score of 0.  Ascending and descending thoracic aortic measurements are normal.  No significant extracardiac abnormality.  PCV ECHOCARDIOGRAM COMPLETE 12/19/2021  Narrative Echocardiogram 12/19/2021: Normal LV systolic function with visual EF 60-65%. Left ventricle cavity is normal in size. Normal left ventricular wall thickness. Presence of a septal bulge. Normal global wall motion. Normal diastolic filling pattern, normal LAP. No significant valvular abnormalities. No prior study for comparison.    PCV MYOCARDIAL PERFUSION WO LEXISCAN 12/10/2021  Narrative Exercise nuclear stress test 12/10/2021: Normal ECG stress. The patient exercised for 4 minutes and 36 seconds of a Bruce protocol, achieving approximately 6.56 METs. 99% MPHR achieved. The blood pressure response was normal. Myocardial perfusion is normal. Fatigue. No chest pain. Overall LV systolic function is normal without regional wall motion abnormalities. Stress LV EF: 82%. No previous exam available for comparison.    EKG:   EKG 11/06/2021: Normal sinus rhythm with rate of 90 bpm.  Assessment     ICD-10-CM   1. Precordial chest pain  R07.2     2. Dyspnea on exertion  R06.09     3. Hypercholesteremia  E78.00        Medications Discontinued During This Encounter  Medication Reason   aspirin 81 MG chewable tablet Discontinued by provider    No orders of the defined types  were placed in this encounter.  No orders of the defined types were placed in this encounter.  Recommendations:   Karen Bass is a 66 y.o. Caucasian female patient with history of mild hyperlipidemia and no history of hypertension, diabetes mellitus referred to me for evaluation of recent onset dyspnea on exertion and chest pain.  Patient states that about [place] and [place], she had chest tightness with radiation toward her neck and associated with marked dyspnea and tach to constantly stop.  She had similar episode recently, and is with a suspicion for angina pectoris, she underwent echocardiogram and nuclear stress testing along with coronary calcium score and presents for follow-up.  Advised her to discontinue aspirin as there is no indication.  Coronary calcium score is 0.  Nuclear stress test reveals normal perfusion but markedly reduced exercise tolerance.  Normal blood pressure response.  My suspicion is that her shortness of breath is probably related to noncardiac etiology and or deconditioning.  Advised her to increase her physical activity at least 20 to 30 minutes on a daily basis and see if her symptoms would improve over the next 3 to 6 months, if symptoms do not improve she could certainly try a bronchodilator therapy or even consider pulmonary referral.  Her symptoms could also be related to subclinical GERD.  With regard to hyperlipidemia, LDL is the only risk factor that she has without hypertension, hyperglycemia or tobacco use.  Hence it is her preference whether she needs to be on a statin therapy.  Patient prefers to wait on this, she would like to start exercise and watch her diet, if LDL is >100 could consider low-dose of statin, previously she was on simvastatin and tolerated this well.  Could potentially start her on 20 mg of the same.  Otherwise stable from cardiac standpoint, I will see her back on a as needed basis.  I have  clearly instructed to her that if she were to have recurrence of chest pain with radiation to the jaw with exertional activity to contact me immediately.   Adrian Prows, MD, [facility] 01/03/2022, 3:12 PM Office: [phone]

## 2022-03-06 DIAGNOSIS — J189 Pneumonia, unspecified organism: Secondary | ICD-10-CM | POA: Diagnosis not present

## 2022-03-26 DIAGNOSIS — Z23 Encounter for immunization: Secondary | ICD-10-CM | POA: Diagnosis not present

## 2022-04-01 ENCOUNTER — Other Ambulatory Visit: Payer: Self-pay | Admitting: Family Medicine

## 2022-04-01 DIAGNOSIS — Z1231 Encounter for screening mammogram for malignant neoplasm of breast: Secondary | ICD-10-CM

## 2022-04-24 DIAGNOSIS — H26493 Other secondary cataract, bilateral: Secondary | ICD-10-CM | POA: Diagnosis not present

## 2022-04-24 DIAGNOSIS — H353121 Nonexudative age-related macular degeneration, left eye, early dry stage: Secondary | ICD-10-CM | POA: Diagnosis not present

## 2022-04-29 ENCOUNTER — Ambulatory Visit: Payer: Medicare Other

## 2022-04-29 DIAGNOSIS — M7581 Other shoulder lesions, right shoulder: Secondary | ICD-10-CM | POA: Diagnosis not present

## 2022-04-29 DIAGNOSIS — R509 Fever, unspecified: Secondary | ICD-10-CM | POA: Diagnosis not present

## 2022-04-29 DIAGNOSIS — R52 Pain, unspecified: Secondary | ICD-10-CM | POA: Diagnosis not present

## 2022-04-29 DIAGNOSIS — Z03818 Encounter for observation for suspected exposure to other biological agents ruled out: Secondary | ICD-10-CM | POA: Diagnosis not present

## 2022-04-29 DIAGNOSIS — B349 Viral infection, unspecified: Secondary | ICD-10-CM | POA: Diagnosis not present

## 2022-05-21 DIAGNOSIS — H26491 Other secondary cataract, right eye: Secondary | ICD-10-CM | POA: Diagnosis not present

## 2022-05-21 DIAGNOSIS — H353131 Nonexudative age-related macular degeneration, bilateral, early dry stage: Secondary | ICD-10-CM | POA: Diagnosis not present

## 2022-05-21 DIAGNOSIS — H26493 Other secondary cataract, bilateral: Secondary | ICD-10-CM | POA: Diagnosis not present

## 2022-05-21 DIAGNOSIS — Z961 Presence of intraocular lens: Secondary | ICD-10-CM | POA: Diagnosis not present

## 2022-05-21 DIAGNOSIS — H18413 Arcus senilis, bilateral: Secondary | ICD-10-CM | POA: Diagnosis not present

## 2022-06-06 DIAGNOSIS — H26492 Other secondary cataract, left eye: Secondary | ICD-10-CM | POA: Diagnosis not present

## 2022-06-06 DIAGNOSIS — H353121 Nonexudative age-related macular degeneration, left eye, early dry stage: Secondary | ICD-10-CM | POA: Diagnosis not present

## 2022-06-18 ENCOUNTER — Ambulatory Visit
Admission: RE | Admit: 2022-06-18 | Discharge: 2022-06-18 | Disposition: A | Discharge status: Home / Self Care | Payer: Medicare Other | Source: Ambulatory Visit | Attending: Family Medicine | Admitting: Family Medicine

## 2022-06-18 DIAGNOSIS — Z1231 Encounter for screening mammogram for malignant neoplasm of breast: Secondary | ICD-10-CM

## 2022-07-30 DIAGNOSIS — D23121 Other benign neoplasm of skin of left upper eyelid, including canthus: Secondary | ICD-10-CM | POA: Diagnosis not present

## 2022-08-19 DIAGNOSIS — D23121 Other benign neoplasm of skin of left upper eyelid, including canthus: Secondary | ICD-10-CM | POA: Diagnosis not present

## 2022-11-20 ENCOUNTER — Ambulatory Visit: Payer: Medicare Other | Admitting: Cardiology

## 2022-11-20 ENCOUNTER — Encounter: Payer: Self-pay | Admitting: Cardiology

## 2022-11-20 VITALS — BP 133/95 | HR 94 | Resp 16 | Ht 66.0 in | Wt 169.4 lb

## 2022-11-20 DIAGNOSIS — E7800 Pure hypercholesterolemia, unspecified: Secondary | ICD-10-CM | POA: Diagnosis not present

## 2022-11-20 DIAGNOSIS — R002 Palpitations: Secondary | ICD-10-CM

## 2022-11-20 DIAGNOSIS — I10 Essential (primary) hypertension: Secondary | ICD-10-CM

## 2022-11-20 MED ORDER — LOSARTAN POTASSIUM-HCTZ 50-12.5 MG PO TABS: 1.0000 | ORAL_TABLET | ORAL | 2 refills | Status: DC

## 2022-11-20 MED ORDER — SIMVASTATIN 40 MG PO TABS: 40.0000 mg | ORAL_TABLET | Freq: Every day | ORAL | 3 refills | Status: DC

## 2022-11-20 NOTE — Progress Notes (Signed)
Primary Physician/Referring:  Soundra Pilon, FNP  Patient ID: Karen Bass, female    DOB: [birth year], 67 y.o.   MRN: [id number]  Chief Complaint  Patient presents with  . Irregular Heart Beat   HPI:    PURVA TREMINIO  is a 67 y.o. Caucasian female patient with fibromyalgia, stage III chronic kidney disease, hypercholesterolemia, history of hyperparathyroidism status post parathyroidectomy remotely, migraine headaches with aura presents for follow-up of palpitations that started about a month ago.  Symptoms described as heartbeats or skipped beats that occur mostly during routine activities at home but not presently with exertional activity.  No other associated symptoms.  No chest pain, no dyspnea, no PND and orthopnea, no hemoptysis, no leg edema or painful swelling of the lower extremity.  Past Medical History:  Diagnosis Date  . COVID-19 07/07/2019 and 07/15/2020   has had both vaccines   . Fibrocystic breast disease   . History of migraine   . Hyperlipidemia   . Hyperparathyroidism (HCC)    history of solitary parathyroidectomy in 1985  . Osteopenia    with bone density testing in March of 2014 with T score -1.6 in the femur   Past Surgical History:  Procedure Laterality Date  . APPENDECTOMY    . BREAST BIOPSY    . BREAST EXCISIONAL BIOPSY Right   . BTL  2003  . ovarian cyst removal/unilateral oophorectomy    . PARATHYROIDECTOMY    . TONSILLECTOMY AND ADENOIDECTOMY    . WISDOM TOOTH EXTRACTION     Family History  Problem Relation Age of Onset  . Breast cancer Mother 66       postmenopausal breast cancer in her 3s  . Dementia Mother   . Basal cell carcinoma Mother   . Thyroid disease Father   . Hypertension Father   . Other Father        PMS, blood clot, elevated sugar   . High Cholesterol Brother        high triglycerides   . Allergic rhinitis Brother   . Heart Problems Brother   . Throat cancer Brother   . Hypertension Brother   . Dementia Maternal  Grandmother   . Colon cancer Neg Hx   . Colon polyps Neg Hx   . Liver disease Neg Hx   . Migraines Neg Hx   . Headache Neg Hx     Social History   Tobacco Use  . Smoking status: Never  . Smokeless tobacco: Never  Substance Use Topics  . Alcohol use: Never   Marital Status: Widowed  ROS  Review of Systems  Cardiovascular:  Positive for palpitations. Negative for chest pain, dyspnea on exertion and leg swelling.   Objective  Blood pressure (!) 133/95, pulse 94, resp. rate 16, height 5\' 6"  (1.676 m), weight 169 lb 6.4 oz (76.8 kg), SpO2 97 %. Body mass index is 27.34 kg/m.     11/20/2022    3:03 PM 01/03/2022    2:13 PM 11/19/2021    1:40 PM  Vitals with BMI  Height 5\' 6"  5\' 6"  5\' 6"   Weight 169 lbs 6 oz 163 lbs 166 lbs 3 oz  BMI 27.35 26.32 26.84  Systolic 133 131 782  Diastolic 95 82 85  Pulse 94 90 89    Physical Exam Neck:     Vascular: No JVD.  Cardiovascular:     Rate and Rhythm: Normal rate and regular rhythm.     Pulses: Intact distal pulses.  Heart sounds: Normal heart sounds. No murmur heard.    No gallop.  Pulmonary:     Effort: Pulmonary effort is normal.     Breath sounds: Normal breath sounds.  Abdominal:     General: Bowel sounds are normal.     Palpations: Abdomen is soft.  Musculoskeletal:     Right lower leg: No edema.     Left lower leg: No edema.   Medications and allergies   Allergies  Allergen Reactions  . Tetracycline     Other reaction(s): insomnia     Medication list after today's encounter   Current Outpatient Medications:  .  Acetaminophen (ACETAMINOPHEN EXTRA STRENGTH) 500 MG capsule, Take 1 tablet by mouth as needed., Disp: , Rfl:  .  alendronate (FOSAMAX) 35 MG tablet, Take 1 tablet by mouth once a week., Disp: , Rfl:  .  Cholecalciferol (VITAMIN D3) 10 MCG (400 UNIT) tablet, Take 400 Units by mouth daily., Disp: , Rfl:  .  Flaxseed, Linseed, (FLAX SEED OIL) 1000 MG CAPS, Take 1,000 capsules by mouth daily., Disp: , Rfl:   .  losartan-hydrochlorothiazide (HYZAAR) 50-12.5 MG tablet, Take 1 tablet by mouth every morning., Disp: 30 tablet, Rfl: 2 .  Lutein 6 MG CAPS, 1 capsule with a meal Orally Once a day, Disp: , Rfl:  .  melatonin 5 MG TABS, Take 5 mg by mouth at bedtime as needed., Disp: , Rfl:  .  Omega-3 Fatty Acids (FISH OIL) 500 MG CAPS, Take 1 capsule by mouth daily., Disp: , Rfl:  .  OVER THE COUNTER MEDICATION, as needed. CORE ONE MIGRAINE RELIEF, Disp: , Rfl:  .  simvastatin (ZOCOR) 40 MG tablet, Take 1 tablet (40 mg total) by mouth at bedtime., Disp: 90 tablet, Rfl: 3 .  Zinc 100 MG TABS, Take 1 tablet by mouth daily., Disp: , Rfl:   Laboratory examination:   External labs:   Labs 12/20/2021:  Glucose 08/28/2019, glycerides 149, HDL 52, LDL 143.  A1C 5.900 % 11/06/2021 TSH 1.530 11/06/2021  Hemoglobin 13.700 g/d 11/06/2021  Creatinine, Serum 1.120 mg/ 11/06/2021 CrCl Est 44.38 11/06/2021 eGFR 54.000 calc 11/06/2021 Potassium 4.400 mm 11/06/2021 ALT (SGPT) 25.000 U/L 11/06/2021  BNP 22.600 PG/ 11/06/2021  Labs 12/19/2020:  Hb 13.7/HCT 40.6, platelets 270, normal indicis.  Serum glucose 88 mg, BUN 28, creatinine 1.01, EGFR 61 ML, LFTs normal.  Total cholesterol 182, triglycerides 157, HDL 45, LDL 110.  Non-HDL cholesterol 137.  Radiology:    Cardiac Studies:   Coronary calcium score 12/29/2021: Coronary calcium score of 0.  Ascending and descending thoracic aortic measurements are normal.  No significant extracardiac abnormality.  PCV ECHOCARDIOGRAM COMPLETE 12/19/2021  Narrative Echocardiogram 12/19/2021: Normal LV systolic function with visual EF 60-65%. Left ventricle cavity is normal in size. Normal left ventricular wall thickness. Presence of a septal bulge. Normal global wall motion. Normal diastolic filling pattern, normal LAP. No significant valvular abnormalities. No prior study for comparison.    PCV MYOCARDIAL PERFUSION WO LEXISCAN 12/10/2021  Narrative Exercise nuclear stress  test 12/10/2021: Normal ECG stress. The patient exercised for 4 minutes and 36 seconds of a Bruce protocol, achieving approximately 6.56 METs. 99% MPHR achieved. The blood pressure response was normal. Myocardial perfusion is normal. Fatigue. No chest pain. Overall LV systolic function is normal without regional wall motion abnormalities. Stress LV EF: 82%. No previous exam available for comparison.    EKG:   EKG 11/20/2022: Normal sinus rhythm at rate of 87 bpm, normal EKG.  Assessment  ICD-10-CM   1. Palpitations  R00.2 EKG 12-Lead    2. Primary hypertension  I10 Basic metabolic panel    3. Hypercholesteremia  E78.00 simvastatin (ZOCOR) 40 MG tablet    Lipid Panel With LDL/HDL Ratio      Medications Discontinued During This Encounter  Medication Reason  . Red Yeast Rice Extract 600 MG CAPS Change in therapy     Meds ordered this encounter  Medications  . simvastatin (ZOCOR) 40 MG tablet    Sig: Take 1 tablet (40 mg total) by mouth at bedtime.    Dispense:  90 tablet    Refill:  3  . losartan-hydrochlorothiazide (HYZAAR) 50-12.5 MG tablet    Sig: Take 1 tablet by mouth every morning.    Dispense:  30 tablet    Refill:  2   Orders Placed This Encounter  Procedures  . Basic metabolic panel  . Lipid Panel With LDL/HDL Ratio  . EKG 12-Lead    Recommendations:   KAYSHLA CAWTHORN is a 67 y.o. Caucasian female patient with fibromyalgia, stage III chronic kidney disease, hypercholesterolemia, history of hyperparathyroidism status post parathyroidectomy remotely, migraine headaches with aura presents for follow-up of palpitations that started about a month ago.  1. Palpitations Symptoms of palpitation clearly suggest PACs and PVCs, mostly occurring at rest lasting few seconds.  Do not think she needs event monitoring.  Suspect elevated blood pressure and hypertension which is untreated may be contributing to her symptoms. - EKG 12-Lead  2. Primary hypertension I have  started her on losartan HCT 50/12.5 mg daily, BMP in 1 month. - Basic metabolic panel  3. Hypercholesteremia She does have hypercholesterolemia, in view of hypertension and hyperlipidemia, her age, she is willing to restart simvastatin that she was previously on at 40 mg daily.  She will need lipid profile testing in 1 month.  She has an appointment to have a complete physical and also labs with her PCP  Cliffton Asters, FNP and I will obtain the reports from him.  I would like to see her back in 6 to 8 weeks for follow-up.  She has had a low risk nuclear stress test, her coronary calcium score has been 0 and echocardiogram was normal.    Yates Decamp, MD, [facility] 11/20/2022, 4:17 PM Office: [phone]

## 2022-11-21 DIAGNOSIS — I10 Essential (primary) hypertension: Secondary | ICD-10-CM | POA: Diagnosis not present

## 2022-11-21 DIAGNOSIS — E7800 Pure hypercholesterolemia, unspecified: Secondary | ICD-10-CM | POA: Diagnosis not present

## 2022-11-22 LAB — LIPID PANEL WITH LDL/HDL RATIO
Cholesterol, Total: 220 mg/dL — ABNORMAL HIGH (ref 100–199)
HDL: 50 mg/dL (ref >39)
LDL Chol Calc (NIH): 141 mg/dL — ABNORMAL HIGH (ref 0–99)
LDL/HDL Ratio: 2.8 ratio (ref 0.0–3.2)
Triglycerides: 159 mg/dL — ABNORMAL HIGH (ref 0–149)
VLDL Cholesterol Cal: 29 mg/dL (ref 5–40)

## 2022-11-22 LAB — BASIC METABOLIC PANEL
BUN/Creatinine Ratio: 17 (ref 12–28)
BUN: 18 mg/dL (ref 8–27)
CO2: 23 mmol/L (ref 20–29)
Calcium: 9.6 mg/dL (ref 8.7–10.3)
Chloride: 102 mmol/L (ref 96–106)
Creatinine, Ser: 1.04 mg/dL — ABNORMAL HIGH (ref 0.57–1.00)
Glucose: 90 mg/dL (ref 70–99)
Potassium: 4.5 mmol/L (ref 3.5–5.2)
Sodium: 140 mmol/L (ref 134–144)
eGFR: 58 mL/min/{1.73_m2} — ABNORMAL LOW (ref >59)

## 2022-11-22 NOTE — Progress Notes (Signed)
Stable kidney function. Your cholesterol is still elevated, hope you are taking the simvastatin. If you are, I need to add Zetia to this in the evening to improve your cholesterol

## 2022-12-12 DIAGNOSIS — Z1211 Encounter for screening for malignant neoplasm of colon: Secondary | ICD-10-CM | POA: Diagnosis not present

## 2022-12-12 DIAGNOSIS — K5730 Diverticulosis of large intestine without perforation or abscess without bleeding: Secondary | ICD-10-CM | POA: Diagnosis not present

## 2022-12-20 DIAGNOSIS — Z0000 Encounter for general adult medical examination without abnormal findings: Secondary | ICD-10-CM | POA: Diagnosis not present

## 2022-12-20 DIAGNOSIS — Z1389 Encounter for screening for other disorder: Secondary | ICD-10-CM | POA: Diagnosis not present

## 2022-12-25 DIAGNOSIS — I10 Essential (primary) hypertension: Secondary | ICD-10-CM | POA: Diagnosis not present

## 2022-12-25 DIAGNOSIS — M3500 Sicca syndrome, unspecified: Secondary | ICD-10-CM | POA: Diagnosis not present

## 2022-12-25 DIAGNOSIS — R7303 Prediabetes: Secondary | ICD-10-CM | POA: Diagnosis not present

## 2022-12-25 DIAGNOSIS — E7800 Pure hypercholesterolemia, unspecified: Secondary | ICD-10-CM | POA: Diagnosis not present

## 2022-12-25 DIAGNOSIS — N1831 Chronic kidney disease, stage 3a: Secondary | ICD-10-CM | POA: Diagnosis not present

## 2022-12-25 DIAGNOSIS — M8580 Other specified disorders of bone density and structure, unspecified site: Secondary | ICD-10-CM | POA: Diagnosis not present

## 2022-12-26 ENCOUNTER — Other Ambulatory Visit: Payer: Self-pay | Admitting: Family Medicine

## 2022-12-26 DIAGNOSIS — M8580 Other specified disorders of bone density and structure, unspecified site: Secondary | ICD-10-CM

## 2023-01-21 ENCOUNTER — Ambulatory Visit: Payer: Medicare Other | Admitting: Cardiology

## 2023-01-31 ENCOUNTER — Ambulatory Visit
Admission: RE | Admit: 2023-01-31 | Discharge: 2023-01-31 | Disposition: A | Discharge status: Home / Self Care | Payer: Medicare Other | Source: Ambulatory Visit | Attending: Family Medicine | Admitting: Family Medicine

## 2023-01-31 DIAGNOSIS — N958 Other specified menopausal and perimenopausal disorders: Secondary | ICD-10-CM | POA: Diagnosis not present

## 2023-01-31 DIAGNOSIS — N189 Chronic kidney disease, unspecified: Secondary | ICD-10-CM | POA: Diagnosis not present

## 2023-01-31 DIAGNOSIS — M8580 Other specified disorders of bone density and structure, unspecified site: Secondary | ICD-10-CM

## 2023-01-31 DIAGNOSIS — M8588 Other specified disorders of bone density and structure, other site: Secondary | ICD-10-CM | POA: Diagnosis not present

## 2023-01-31 DIAGNOSIS — E349 Endocrine disorder, unspecified: Secondary | ICD-10-CM | POA: Diagnosis not present

## 2023-02-14 ENCOUNTER — Other Ambulatory Visit: Payer: Self-pay | Admitting: Cardiology

## 2023-02-19 ENCOUNTER — Ambulatory Visit: Payer: Medicare Other | Admitting: Cardiology

## 2023-02-19 DIAGNOSIS — D2271 Melanocytic nevi of right lower limb, including hip: Secondary | ICD-10-CM | POA: Diagnosis not present

## 2023-02-19 DIAGNOSIS — D225 Melanocytic nevi of trunk: Secondary | ICD-10-CM | POA: Diagnosis not present

## 2023-02-19 DIAGNOSIS — D2262 Melanocytic nevi of left upper limb, including shoulder: Secondary | ICD-10-CM | POA: Diagnosis not present

## 2023-02-19 DIAGNOSIS — C44719 Basal cell carcinoma of skin of left lower limb, including hip: Secondary | ICD-10-CM | POA: Diagnosis not present

## 2023-02-19 DIAGNOSIS — D2261 Melanocytic nevi of right upper limb, including shoulder: Secondary | ICD-10-CM | POA: Diagnosis not present

## 2023-02-19 DIAGNOSIS — L821 Other seborrheic keratosis: Secondary | ICD-10-CM | POA: Diagnosis not present

## 2023-02-19 DIAGNOSIS — Z85828 Personal history of other malignant neoplasm of skin: Secondary | ICD-10-CM | POA: Diagnosis not present

## 2023-02-19 DIAGNOSIS — D485 Neoplasm of uncertain behavior of skin: Secondary | ICD-10-CM | POA: Diagnosis not present

## 2023-02-19 DIAGNOSIS — D045 Carcinoma in situ of skin of trunk: Secondary | ICD-10-CM | POA: Diagnosis not present

## 2023-02-19 DIAGNOSIS — L905 Scar conditions and fibrosis of skin: Secondary | ICD-10-CM | POA: Diagnosis not present

## 2023-02-19 DIAGNOSIS — L570 Actinic keratosis: Secondary | ICD-10-CM | POA: Diagnosis not present

## 2023-02-19 DIAGNOSIS — L918 Other hypertrophic disorders of the skin: Secondary | ICD-10-CM | POA: Diagnosis not present

## 2023-02-27 ENCOUNTER — Encounter: Payer: Self-pay | Admitting: Cardiology

## 2023-02-27 ENCOUNTER — Ambulatory Visit: Payer: Medicare Other | Admitting: Cardiology

## 2023-02-27 VITALS — BP 132/82 | HR 83 | Resp 16 | Ht 66.0 in | Wt 160.0 lb

## 2023-02-27 DIAGNOSIS — I10 Essential (primary) hypertension: Secondary | ICD-10-CM | POA: Diagnosis not present

## 2023-02-27 DIAGNOSIS — R002 Palpitations: Secondary | ICD-10-CM | POA: Diagnosis not present

## 2023-02-27 DIAGNOSIS — E7800 Pure hypercholesterolemia, unspecified: Secondary | ICD-10-CM

## 2023-02-27 NOTE — Progress Notes (Signed)
Primary Physician/Referring:  Soundra Pilon, FNP  Patient ID: Karen Bass, female    DOB: 1956-04-23, 67 y.o.   MRN: [id number]  Chief Complaint  Patient presents with   Palpitations   Hypertension   Follow-up    2 month   HPI:    Karen Bass  is a 67 y.o. Caucasian female patient with fibromyalgia, stage III chronic kidney disease, hypercholesterolemia, history of hyperparathyroidism status post parathyroidectomy remotely, migraine headaches with aura presents for follow-up of palpitations that started about a month ago.  She has not had any palpitations since last visit. She was started on losartan-hydrochlorothiazide but she never took it. Blood pressure has been low normal without it. She admits to not drinking enough water. She has also not been taking Zetia, but is taking simvastatin.  Past Medical History:  Diagnosis Date   COVID-19 07/07/2019 and 07/15/2020   has had both vaccines    Fibrocystic breast disease    History of migraine    Hyperlipidemia    Hyperparathyroidism (HCC)    history of solitary parathyroidectomy in 1985   Osteopenia    with bone density testing in March of 2014 with T score -1.6 in the femur   Past Surgical History:  Procedure Laterality Date   APPENDECTOMY     BREAST BIOPSY     BREAST EXCISIONAL BIOPSY Right    BTL  2003   ovarian cyst removal/unilateral oophorectomy     PARATHYROIDECTOMY     TONSILLECTOMY AND ADENOIDECTOMY     WISDOM TOOTH EXTRACTION     Family History  Problem Relation Age of Onset   Breast cancer Mother 66       postmenopausal breast cancer in her 47s   Dementia Mother    Basal cell carcinoma Mother    Thyroid disease Father    Hypertension Father    Other Father        PMS, blood clot, elevated sugar    High Cholesterol Brother        high triglycerides    Allergic rhinitis Brother    Heart Problems Brother    Throat cancer Brother    Hypertension Brother    Dementia Maternal Grandmother     Colon cancer Neg Hx    Colon polyps Neg Hx    Liver disease Neg Hx    Migraines Neg Hx    Headache Neg Hx     Social History   Tobacco Use   Smoking status: Never   Smokeless tobacco: Never  Substance Use Topics   Alcohol use: Never   Marital Status: Widowed  ROS  Review of Systems  Cardiovascular:  Positive for palpitations. Negative for chest pain, dyspnea on exertion and leg swelling.   Objective  Blood pressure 132/82, pulse 83, resp. rate 16, height 5\' 6"  (1.676 m), weight 160 lb (72.6 kg), SpO2 97%. Body mass index is 25.82 kg/m.     02/27/2023   12:41 PM 11/20/2022    3:03 PM 01/03/2022    2:13 PM  Vitals with BMI  Height 5\' 6"  5\' 6"  5\' 6"   Weight 160 lbs 169 lbs 6 oz 163 lbs  BMI 25.84 27.35 26.32  Systolic 132 133 409  Diastolic 82 95 82  Pulse 83 94 90    Physical Exam Neck:     Vascular: No JVD.  Cardiovascular:     Rate and Rhythm: Normal rate and regular rhythm.     Pulses: Intact distal pulses.  Heart sounds: Normal heart sounds. No murmur heard.    No gallop.  Pulmonary:     Effort: Pulmonary effort is normal.     Breath sounds: Normal breath sounds.  Abdominal:     General: Bowel sounds are normal.     Palpations: Abdomen is soft.  Musculoskeletal:     Right lower leg: No edema.     Left lower leg: No edema.    Laboratory examination:   Lab Results  Component Value Date   CHOL 220 (H) 11/21/2022   HDL 50 11/21/2022   LDLCALC 141 (H) 11/21/2022   TRIG 159 (H) 11/21/2022    Lab Results  Component Value Date   NA 140 11/21/2022   K 4.5 11/21/2022   CO2 23 11/21/2022   GLUCOSE 90 11/21/2022   BUN 18 11/21/2022   CREATININE 1.04 (H) 11/21/2022   CALCIUM 9.6 11/21/2022   EGFR 58 (L) 11/21/2022    External labs:   Labs 12/20/2021:  Glucose 08/28/2019, glycerides 149, HDL 52, LDL 143.  A1C 5.900 % 11/06/2021 TSH 1.530 11/06/2021  Hemoglobin 13.700 g/d 11/06/2021  Creatinine, Serum 1.120 mg/ 11/06/2021 CrCl Est 44.38 11/06/2021 eGFR  54.000 calc 11/06/2021 Potassium 4.400 mm 11/06/2021 ALT (SGPT) 25.000 U/L 11/06/2021  BNP 22.600 PG/ 11/06/2021  Radiology:    Cardiac Studies:   Coronary calcium score 12/29/2021: Coronary calcium score of 0.  Ascending and descending thoracic aortic measurements are normal.  No significant extracardiac abnormality.  PCV ECHOCARDIOGRAM COMPLETE 12/19/2021  Narrative Echocardiogram 12/19/2021: Normal LV systolic function with visual EF 60-65%. Left ventricle cavity is normal in size. Normal left ventricular wall thickness. Presence of a septal bulge. Normal global wall motion. Normal diastolic filling pattern, normal LAP. No significant valvular abnormalities. No prior study for comparison.    PCV MYOCARDIAL PERFUSION WO LEXISCAN 12/10/2021  Narrative Exercise nuclear stress test 12/10/2021: Normal ECG stress. The patient exercised for 4 minutes and 36 seconds of a Bruce protocol, achieving approximately 6.56 METs. 99% MPHR achieved. The blood pressure response was normal. Myocardial perfusion is normal. Fatigue. No chest pain. Overall LV systolic function is normal without regional wall motion abnormalities. Stress LV EF: 82%. No previous exam available for comparison.    EKG:   EKG 11/20/2022: Normal sinus rhythm at rate of 87 bpm, normal EKG.   Medications and allergies   Allergies  Allergen Reactions   Tetracycline     Other reaction(s): insomnia    Current Outpatient Medications:    Acetaminophen (ACETAMINOPHEN EXTRA STRENGTH) 500 MG capsule, Take 1 tablet by mouth as needed., Disp: , Rfl:    alendronate (FOSAMAX) 35 MG tablet, Take 1 tablet by mouth once a week., Disp: , Rfl:    cephALEXin (KEFLEX) 500 MG capsule, Take 500 mg by mouth 2 (two) times daily., Disp: , Rfl:    Cholecalciferol (VITAMIN D3) 10 MCG (400 UNIT) tablet, Take 400 Units by mouth daily., Disp: , Rfl:    Flaxseed, Linseed, (FLAX SEED OIL) 1000 MG CAPS, Take 1,000 capsules by mouth daily., Disp: ,  Rfl:    Lutein 6 MG CAPS, 1 capsule with a meal Orally Once a day, Disp: , Rfl:    Omega-3 Fatty Acids (FISH OIL) 500 MG CAPS, Take 1 capsule by mouth daily., Disp: , Rfl:    OVER THE COUNTER MEDICATION, as needed. CORE ONE MIGRAINE RELIEF, Disp: , Rfl:    simvastatin (ZOCOR) 40 MG tablet, Take 1 tablet (40 mg total) by mouth at bedtime., Disp: 90 tablet, Rfl:  3   Zinc 100 MG TABS, Take 1 tablet by mouth daily., Disp: , Rfl:    melatonin 5 MG TABS, Take 5 mg by mouth at bedtime as needed. (Patient not taking: Reported on 02/27/2023), Disp: , Rfl:    Assessment     ICD-10-CM   1. Primary hypertension  I10     2. Palpitations  R00.2     3. Hypercholesteremia  E78.00        Medications Discontinued During This Encounter  Medication Reason   losartan-hydrochlorothiazide (HYZAAR) 50-12.5 MG tablet      Orders Placed This Encounter  Procedures   Lipid panel    Recommendations:   DALA NOLTON is a 67 y.o. Caucasian female patient with fibromyalgia, stage III chronic kidney disease, hypercholesterolemia, history of hyperparathyroidism status post parathyroidectomy remotely, migraine headaches with aura presents for follow-up of palpitations that started about a month ago.  1. Palpitations: Resolved. Monitor for now.  2. Primary hypertension: Also resolved. Not on lossartan-hydrochlorothiazide. Not needed at this time.  Encourage liberal hydration given low normal blood pressures.  3. Hypercholesteremia: She had just started taking simvastatin a the time of last lipid panel check. Repeat now. Fortunately, calcium score was 0 in 2022. She may be okay with simvastatin alone.  F/u as needed   Yates Decamp, MD, [facility] 02/27/2023, 12:48 PM Office: [phone]

## 2023-03-04 DIAGNOSIS — E7800 Pure hypercholesterolemia, unspecified: Secondary | ICD-10-CM | POA: Diagnosis not present

## 2023-03-05 LAB — LIPID PANEL
Chol/HDL Ratio: 2.6 ratio (ref 0.0–4.4)
Cholesterol, Total: 121 mg/dL (ref 100–199)
HDL: 46 mg/dL (ref >39)
LDL Chol Calc (NIH): 57 mg/dL (ref 0–99)
Triglycerides: 97 mg/dL (ref 0–149)
VLDL Cholesterol Cal: 18 mg/dL (ref 5–40)

## 2023-03-12 DIAGNOSIS — Z23 Encounter for immunization: Secondary | ICD-10-CM | POA: Diagnosis not present

## 2023-03-31 DIAGNOSIS — Z23 Encounter for immunization: Secondary | ICD-10-CM | POA: Diagnosis not present

## 2023-04-09 ENCOUNTER — Other Ambulatory Visit: Payer: Self-pay | Admitting: Family Medicine

## 2023-04-09 DIAGNOSIS — Z1231 Encounter for screening mammogram for malignant neoplasm of breast: Secondary | ICD-10-CM

## 2023-05-14 DIAGNOSIS — Z23 Encounter for immunization: Secondary | ICD-10-CM | POA: Diagnosis not present

## 2023-06-20 ENCOUNTER — Ambulatory Visit
Admission: RE | Admit: 2023-06-20 | Discharge: 2023-06-20 | Disposition: A | Discharge status: Home / Self Care | Payer: Medicare Other | Source: Ambulatory Visit | Attending: Family Medicine | Admitting: Family Medicine

## 2023-06-20 DIAGNOSIS — Z1231 Encounter for screening mammogram for malignant neoplasm of breast: Secondary | ICD-10-CM | POA: Diagnosis not present

## 2023-11-07 ENCOUNTER — Other Ambulatory Visit: Payer: Self-pay | Admitting: Cardiology

## 2023-11-07 ENCOUNTER — Telehealth: Payer: Self-pay | Admitting: Cardiology

## 2023-11-07 DIAGNOSIS — E7800 Pure hypercholesterolemia, unspecified: Secondary | ICD-10-CM

## 2023-11-07 MED ORDER — SIMVASTATIN 40 MG PO TABS: 40.0000 mg | ORAL_TABLET | Freq: Every day | ORAL | 1 refills | Status: DC

## 2023-11-07 NOTE — Telephone Encounter (Signed)
*  STAT* If patient is at the pharmacy, call can be transferred to refill team.   1. Which medications need to be refilled? (please list name of each medication and dose if known)   simvastatin  (ZOCOR ) 40 MG tablet (Expired)   2. Would you like to learn more about the convenience, safety, & potential cost savings by using the [facility]?   3. Are you open to using the [facility] (Type [facility]. ).  4. Which pharmacy/location (including street and city if local pharmacy) is medication to be sent to?  [id number] [place],  - [address]   5. Do they need a 30 day or 90 day supply?   90 day   Patient stated she still has medication left.

## 2023-11-07 NOTE — Telephone Encounter (Signed)
 Pt's medication was sent to pt's pharmacy as requested. Confirmation received.

## 2023-12-25 DIAGNOSIS — R7303 Prediabetes: Secondary | ICD-10-CM | POA: Diagnosis not present

## 2023-12-25 DIAGNOSIS — Z0000 Encounter for general adult medical examination without abnormal findings: Secondary | ICD-10-CM | POA: Diagnosis not present

## 2023-12-25 DIAGNOSIS — E7800 Pure hypercholesterolemia, unspecified: Secondary | ICD-10-CM | POA: Diagnosis not present

## 2023-12-25 DIAGNOSIS — G43109 Migraine with aura, not intractable, without status migrainosus: Secondary | ICD-10-CM | POA: Diagnosis not present

## 2023-12-25 DIAGNOSIS — N1831 Chronic kidney disease, stage 3a: Secondary | ICD-10-CM | POA: Diagnosis not present

## 2023-12-25 DIAGNOSIS — M797 Fibromyalgia: Secondary | ICD-10-CM | POA: Diagnosis not present

## 2023-12-25 DIAGNOSIS — E213 Hyperparathyroidism, unspecified: Secondary | ICD-10-CM | POA: Diagnosis not present

## 2023-12-25 DIAGNOSIS — M8580 Other specified disorders of bone density and structure, unspecified site: Secondary | ICD-10-CM | POA: Diagnosis not present

## 2023-12-25 DIAGNOSIS — I10 Essential (primary) hypertension: Secondary | ICD-10-CM | POA: Diagnosis not present

## 2024-02-19 DIAGNOSIS — L918 Other hypertrophic disorders of the skin: Secondary | ICD-10-CM | POA: Diagnosis not present

## 2024-02-19 DIAGNOSIS — L738 Other specified follicular disorders: Secondary | ICD-10-CM | POA: Diagnosis not present

## 2024-02-19 DIAGNOSIS — L723 Sebaceous cyst: Secondary | ICD-10-CM | POA: Diagnosis not present

## 2024-02-19 DIAGNOSIS — C44719 Basal cell carcinoma of skin of left lower limb, including hip: Secondary | ICD-10-CM | POA: Diagnosis not present

## 2024-02-19 DIAGNOSIS — D485 Neoplasm of uncertain behavior of skin: Secondary | ICD-10-CM | POA: Diagnosis not present

## 2024-02-19 DIAGNOSIS — Z85828 Personal history of other malignant neoplasm of skin: Secondary | ICD-10-CM | POA: Diagnosis not present

## 2024-02-19 DIAGNOSIS — L821 Other seborrheic keratosis: Secondary | ICD-10-CM | POA: Diagnosis not present

## 2024-02-19 DIAGNOSIS — D225 Melanocytic nevi of trunk: Secondary | ICD-10-CM | POA: Diagnosis not present

## 2024-04-12 DIAGNOSIS — R399 Unspecified symptoms and signs involving the genitourinary system: Secondary | ICD-10-CM | POA: Diagnosis not present

## 2024-04-12 DIAGNOSIS — N390 Urinary tract infection, site not specified: Secondary | ICD-10-CM | POA: Diagnosis not present

## 2024-05-05 ENCOUNTER — Other Ambulatory Visit: Payer: Self-pay | Admitting: Cardiology

## 2024-05-05 DIAGNOSIS — E7800 Pure hypercholesterolemia, unspecified: Secondary | ICD-10-CM

## 2024-05-05 NOTE — Telephone Encounter (Signed)
 Patient usually sees Dr. Ladona, seen by me once in 02/2023.  She is not seen either Dr. Ladona or myself in over 1 year.  If patient is running out of this medication, you can send 30-day prescription with no refill until May, and schedule follow-up preferable.  Dr. Ganji or any other provider in the coming days.  Thanks MJP

## 2024-05-05 NOTE — Telephone Encounter (Signed)
 Pt last seen by Dr. Elmira. This RX was prescribed by Dr. Ladona. Does Dr. Elmira want to refill? Please advise

## 2024-05-07 DIAGNOSIS — Z23 Encounter for immunization: Secondary | ICD-10-CM | POA: Diagnosis not present

## 2024-05-20 ENCOUNTER — Other Ambulatory Visit: Payer: Self-pay | Admitting: Family Medicine

## 2024-05-20 DIAGNOSIS — Z1231 Encounter for screening mammogram for malignant neoplasm of breast: Secondary | ICD-10-CM

## 2024-05-29 ENCOUNTER — Other Ambulatory Visit: Payer: Self-pay | Admitting: Cardiology

## 2024-05-29 DIAGNOSIS — E7800 Pure hypercholesterolemia, unspecified: Secondary | ICD-10-CM

## 2024-06-21 ENCOUNTER — Ambulatory Visit
Admission: RE | Admit: 2024-06-21 | Discharge: 2024-06-21 | Disposition: A | Source: Ambulatory Visit | Attending: Family Medicine | Admitting: Family Medicine

## 2024-06-21 DIAGNOSIS — Z1231 Encounter for screening mammogram for malignant neoplasm of breast: Secondary | ICD-10-CM

## 2024-06-27 ENCOUNTER — Other Ambulatory Visit: Payer: Self-pay | Admitting: Cardiology

## 2024-06-27 DIAGNOSIS — E7800 Pure hypercholesterolemia, unspecified: Secondary | ICD-10-CM

## 2024-07-06 ENCOUNTER — Other Ambulatory Visit: Payer: Self-pay | Admitting: Cardiology

## 2024-07-06 DIAGNOSIS — E7800 Pure hypercholesterolemia, unspecified: Secondary | ICD-10-CM

## 2024-07-06 NOTE — Telephone Encounter (Signed)
 Please advise patient to make yearly follow-up appointment with cardiologist for future refills. Thank you and 3rd attempt
